# Patient Record
Sex: Male | Born: 1945
Health system: Southern US, Community
[De-identification: ages and names within clinical notes are randomized; demographics above are authoritative.]

## PROBLEM LIST (undated history)

## (undated) DIAGNOSIS — I10 Essential (primary) hypertension: Secondary | ICD-10-CM

## (undated) DIAGNOSIS — I639 Cerebral infarction, unspecified: Secondary | ICD-10-CM

## (undated) DIAGNOSIS — Z720 Tobacco use: Secondary | ICD-10-CM

## (undated) DIAGNOSIS — R519 Headache, unspecified: Secondary | ICD-10-CM

## (undated) DIAGNOSIS — R51 Headache: Secondary | ICD-10-CM

## (undated) HISTORY — DX: Tobacco use: Z72.0

## (undated) HISTORY — DX: Headache: R51

## (undated) HISTORY — DX: Headache, unspecified: R51.9

---

## 1997-06-21 HISTORY — PX: LUNG SURGERY: SHX703

## 1999-08-01 ENCOUNTER — Emergency Department (HOSPITAL_COMMUNITY): Admission: EM | Admit: 1999-08-01 | Discharge: 1999-08-01 | Payer: Self-pay | Admitting: Emergency Medicine

## 1999-08-02 ENCOUNTER — Encounter: Payer: Self-pay | Admitting: Emergency Medicine

## 1999-08-07 ENCOUNTER — Encounter: Payer: Self-pay | Admitting: Thoracic Surgery

## 1999-08-10 ENCOUNTER — Ambulatory Visit (HOSPITAL_COMMUNITY): Admission: RE | Admit: 1999-08-10 | Discharge: 1999-08-10 | Payer: Self-pay | Admitting: Thoracic Surgery

## 1999-08-10 ENCOUNTER — Encounter (INDEPENDENT_AMBULATORY_CARE_PROVIDER_SITE_OTHER): Payer: Self-pay | Admitting: Specialist

## 1999-08-14 ENCOUNTER — Encounter: Payer: Self-pay | Admitting: Thoracic Surgery

## 1999-08-17 ENCOUNTER — Encounter: Payer: Self-pay | Admitting: Thoracic Surgery

## 1999-08-17 ENCOUNTER — Inpatient Hospital Stay (HOSPITAL_COMMUNITY): Admission: RE | Admit: 1999-08-17 | Discharge: 1999-08-22 | Payer: Self-pay | Admitting: Thoracic Surgery

## 1999-08-17 ENCOUNTER — Encounter (INDEPENDENT_AMBULATORY_CARE_PROVIDER_SITE_OTHER): Payer: Self-pay | Admitting: *Deleted

## 1999-08-18 ENCOUNTER — Encounter: Payer: Self-pay | Admitting: Thoracic Surgery

## 1999-08-19 ENCOUNTER — Encounter: Payer: Self-pay | Admitting: Thoracic Surgery

## 1999-08-20 ENCOUNTER — Encounter: Payer: Self-pay | Admitting: Dermatology

## 1999-08-20 ENCOUNTER — Encounter: Payer: Self-pay | Admitting: Thoracic Surgery

## 1999-08-21 ENCOUNTER — Encounter: Payer: Self-pay | Admitting: Thoracic Surgery

## 1999-09-04 ENCOUNTER — Encounter: Admission: RE | Admit: 1999-09-04 | Discharge: 1999-09-04 | Payer: Self-pay | Admitting: Thoracic Surgery

## 1999-09-04 ENCOUNTER — Encounter: Payer: Self-pay | Admitting: Thoracic Surgery

## 1999-10-20 ENCOUNTER — Encounter: Admission: RE | Admit: 1999-10-20 | Discharge: 1999-10-20 | Payer: Self-pay | Admitting: Thoracic Surgery

## 1999-10-20 ENCOUNTER — Encounter: Payer: Self-pay | Admitting: Thoracic Surgery

## 1999-12-22 ENCOUNTER — Encounter: Admission: RE | Admit: 1999-12-22 | Discharge: 1999-12-22 | Payer: Self-pay | Admitting: Thoracic Surgery

## 1999-12-22 ENCOUNTER — Encounter: Payer: Self-pay | Admitting: Thoracic Surgery

## 2000-06-29 ENCOUNTER — Encounter: Admission: RE | Admit: 2000-06-29 | Discharge: 2000-06-29 | Payer: Self-pay | Admitting: Thoracic Surgery

## 2000-06-29 ENCOUNTER — Encounter: Payer: Self-pay | Admitting: Thoracic Surgery

## 2001-01-25 ENCOUNTER — Encounter: Admission: RE | Admit: 2001-01-25 | Discharge: 2001-01-25 | Payer: Self-pay | Admitting: Thoracic Surgery

## 2001-01-25 ENCOUNTER — Encounter: Payer: Self-pay | Admitting: Thoracic Surgery

## 2001-08-02 ENCOUNTER — Encounter: Payer: Self-pay | Admitting: Thoracic Surgery

## 2001-08-02 ENCOUNTER — Encounter: Admission: RE | Admit: 2001-08-02 | Discharge: 2001-08-02 | Payer: Self-pay | Admitting: Thoracic Surgery

## 2003-08-08 ENCOUNTER — Ambulatory Visit (HOSPITAL_COMMUNITY): Admission: RE | Admit: 2003-08-08 | Discharge: 2003-08-08 | Payer: Self-pay | Admitting: Internal Medicine

## 2003-12-20 ENCOUNTER — Ambulatory Visit (HOSPITAL_COMMUNITY): Admission: RE | Admit: 2003-12-20 | Discharge: 2003-12-20 | Payer: Self-pay | Admitting: Family Medicine

## 2005-02-24 ENCOUNTER — Ambulatory Visit (HOSPITAL_COMMUNITY): Admission: RE | Admit: 2005-02-24 | Discharge: 2005-02-24 | Payer: Self-pay | Admitting: Gastroenterology

## 2007-08-24 ENCOUNTER — Inpatient Hospital Stay (HOSPITAL_COMMUNITY): Admission: EM | Admit: 2007-08-24 | Discharge: 2007-08-25 | Payer: Self-pay | Admitting: Emergency Medicine

## 2007-08-24 ENCOUNTER — Encounter (INDEPENDENT_AMBULATORY_CARE_PROVIDER_SITE_OTHER): Payer: Self-pay | Admitting: Neurology

## 2007-08-25 ENCOUNTER — Encounter (INDEPENDENT_AMBULATORY_CARE_PROVIDER_SITE_OTHER): Payer: Self-pay | Admitting: Neurology

## 2007-08-28 ENCOUNTER — Inpatient Hospital Stay (HOSPITAL_COMMUNITY): Admission: EM | Admit: 2007-08-28 | Discharge: 2007-08-30 | Payer: Self-pay | Admitting: Emergency Medicine

## 2007-09-04 ENCOUNTER — Encounter: Admission: RE | Admit: 2007-09-04 | Discharge: 2007-10-25 | Payer: Self-pay | Admitting: Neurology

## 2009-05-24 ENCOUNTER — Inpatient Hospital Stay (HOSPITAL_COMMUNITY): Admission: EM | Admit: 2009-05-24 | Discharge: 2009-05-25 | Payer: Self-pay | Admitting: Emergency Medicine

## 2009-05-25 ENCOUNTER — Encounter (INDEPENDENT_AMBULATORY_CARE_PROVIDER_SITE_OTHER): Payer: Self-pay | Admitting: Internal Medicine

## 2009-06-17 ENCOUNTER — Encounter (INDEPENDENT_AMBULATORY_CARE_PROVIDER_SITE_OTHER): Payer: Self-pay | Admitting: Family Medicine

## 2009-06-17 ENCOUNTER — Ambulatory Visit (HOSPITAL_COMMUNITY): Admission: RE | Admit: 2009-06-17 | Discharge: 2009-06-17 | Payer: Self-pay | Admitting: Internal Medicine

## 2009-06-17 ENCOUNTER — Ambulatory Visit: Payer: Self-pay

## 2009-06-17 ENCOUNTER — Ambulatory Visit: Payer: Self-pay | Admitting: Cardiovascular Disease

## 2010-09-22 LAB — URINE CULTURE
Colony Count: NO GROWTH
Culture: NO GROWTH

## 2010-09-22 LAB — COMPREHENSIVE METABOLIC PANEL
AST: 26 U/L (ref 0–37)
Alkaline Phosphatase: 71 U/L (ref 39–117)
BUN: 10 mg/dL (ref 6–23)
CO2: 26 mEq/L (ref 19–32)
Chloride: 105 mEq/L (ref 96–112)
Creatinine, Ser: 1.04 mg/dL (ref 0.4–1.5)
GFR calc Af Amer: >60 mL/min (ref >60)
GFR calc non Af Amer: >60 mL/min (ref >60)
Potassium: 3.9 mEq/L (ref 3.5–5.1)
Total Bilirubin: 1.3 mg/dL — ABNORMAL HIGH (ref 0.3–1.2)

## 2010-09-22 LAB — LIPID PANEL
HDL: 28 mg/dL — ABNORMAL LOW (ref >39)
LDL Cholesterol: 67 mg/dL (ref 0–99)
Triglycerides: 100 mg/dL (ref <150)
VLDL: 20 mg/dL (ref 0–40)

## 2010-09-22 LAB — DIFFERENTIAL
Basophils Absolute: 0 10*3/uL (ref 0.0–0.1)
Basophils Relative: 0 % (ref 0–1)
Eosinophils Relative: 1 % (ref 0–5)
Lymphocytes Relative: 11 % — ABNORMAL LOW (ref 12–46)
Monocytes Absolute: 0.4 10*3/uL (ref 0.1–1.0)

## 2010-09-22 LAB — CBC
HCT: 51.7 % (ref 39.0–52.0)
MCV: 91.4 fL (ref 78.0–100.0)
RBC: 5.66 MIL/uL (ref 4.22–5.81)
WBC: 6.9 10*3/uL (ref 4.0–10.5)

## 2010-09-22 LAB — POCT I-STAT, CHEM 8
Calcium, Ion: 1.03 mmol/L — ABNORMAL LOW (ref 1.12–1.32)
Creatinine, Ser: 0.9 mg/dL (ref 0.4–1.5)
Glucose, Bld: 119 mg/dL — ABNORMAL HIGH (ref 70–99)
HCT: 55 % — ABNORMAL HIGH (ref 39.0–52.0)
Hemoglobin: 18.7 g/dL — ABNORMAL HIGH (ref 13.0–17.0)
Potassium: 4 mEq/L (ref 3.5–5.1)
TCO2: 25 mmol/L (ref 0–100)

## 2010-09-22 LAB — URINALYSIS, ROUTINE W REFLEX MICROSCOPIC
Glucose, UA: NEGATIVE mg/dL
Hgb urine dipstick: NEGATIVE
Protein, ur: NEGATIVE mg/dL
Specific Gravity, Urine: 1.009 (ref 1.005–1.030)
pH: 7 (ref 5.0–8.0)

## 2010-09-22 LAB — PROTIME-INR: Prothrombin Time: 13.3 seconds (ref 11.6–15.2)

## 2010-09-22 LAB — POCT CARDIAC MARKERS: Myoglobin, poc: 115 ng/mL (ref 12–200)

## 2010-09-22 LAB — HOMOCYSTEINE: Homocysteine: 9 umol/L (ref 4.0–15.4)

## 2010-10-22 ENCOUNTER — Other Ambulatory Visit: Payer: Self-pay | Admitting: Dermatology

## 2010-11-03 NOTE — H&P (Signed)
NAME:  Billy [name], Billy [name] NO.:  [id number]   MEDICAL RECORD NO.:  [id number]          PATIENT TYPE:  EMS   LOCATION:  MAJO                         FACILITY:  [facility]   PHYSICIAN:  Marlan Palau, M.D.  DATE OF BIRTH:  06/01/1946   DATE OF ADMISSION:  08/24/2007  DATE OF DISCHARGE:                              HISTORY & PHYSICAL   NEUROLOGY ADMISSION NOTE   HISTORY OF PRESENT ILLNESS:  Billy [name] is a 65 year old right-handed  white male born 1946-01-06 with a history of hypertension and  tobacco abuse.  This patient went to bed at 11:30 p.m. on August 23, 2007,  feeling well.  The patient was last seen normal at this time.  Patient  awakened at 3:00 a.m. to go the bathroom and was noted to have double  vision and staggering gait at that time.  The patient went back to bed  but had persistent symptoms this morning and decided to come to the  emergency room for further evaluation.  The patient has been noted to  have abdominal paresis with incomplete abduction of the right eye with  left lateral gaze and prominent nystagmus with rotatory component and  upbeat nystagmus, particularly with the left eye.  On primary gaze, the  left eye is inferior to the right.  The patient has reported some  numbness of the tongue.  It is not clear that the lips are numb and  believes that the numbness goes across both sides.  The patient has no  change in speech or swallowing that he knows of, does have a staggery  gait, may stagger from side to side and has not had any falls.  The  patient denies any numbness or weakness on the arms or legs, has had no  visual field changes.  The patient does report a headache but has been  treated for sinus problems over the last week or so and has been taking  daily Naproxen, Reglan and Cafergot.  The patient comes to the hospital  at this point for evaluation of a possible brain stem stroke.  NIH  stroke scale score is one.   PAST MEDICAL  HISTORY:  Is significant for:  1. New onset of  ophthalmoparesis, tongue numbness rule out stroke      event.  2. Cluster headaches/migraine.  3. Mesothelioma status post resection 9 years ago.  4. Melanoma resection in the past on the right abdomen.  5. Hypertension.  6. Cervical spine surgery.  7. Left hand fracture status post surgery in the past.   MEDICATIONS:  Include:  1. Doxazosin 8 mg daily.  2. Lotrel 10/1938 mg daily.  3. Naproxen, Reglan, Cafergot if needed for headache.  4. Aspirin 81 mg daily.   ALLERGIES:  PATIENT HAS INTOLERANCE TO MORPHINE.   SOCIAL HISTORY:  Smokes a pack of cigarettes daily.  Does not drink  alcohol.   SOCIAL HISTORY:  This patient is a widower, lives in the [place],  [place] area, is retired, has no children.  The patient has two  stepchildren.   FAMILY MEDICAL HISTORY:  Notable for the fact that mother died in her  26s and had a history of breast cancer.  Father died around age 19 with  cirrhosis of the liver from alcoholism.  Patient has two sisters who are  alive and well.  No family history of diabetes is noted.   REVIEW OF SYSTEMS:  Notable for some occasional night sweats over the  last week.  The patient does report some chronic neck pain and chronic  shortness of breath, denies chest pain, denies abdominal pain, did have  some nausea and vomiting today.  Denies any problems controlling bowels  or bladder, has not had any blackout episodes or reports of dizziness.   PHYSICAL EXAMINATION:  VITALS:  Blood pressure is 140/86, heart rate of  99, respiratory rate 18, temperature afebrile.  GENERAL:  This patient is a fairly well-developed/slightly obese white  male who is alert, cooperative at the time of examination.  HEENT:  Head  is atraumatic.  Eyes:  Pupils are round, react to light, symmetric.  Disks are flat.  NECK:  Supple.  No carotid bruits noted.  RESPIRATORY:  Examination is clear the exception of occasional  wheezes  posteriorly on the right.  CARDIOVASCULAR:  Reveals a regular rate and rhythm.  No obvious murmurs  or rubs noted.  ABDOMEN:  Reveals positive bowel sounds.  No organomegaly or tenderness  noted.  EXTREMITIES:  Without significant edema.  NEUROLOGIC:  Cranial nerves as above.  Facial symmetry is present.  The  patient on primary gaze:  It is notable that the left eye is inferior to  the right.  The patient has prominent rotatory nystagmus, particularly  looking towards the right.  With looking to the left, the patient has  upbeat nystagmus with the left eye.  Incomplete abduction of the right  eye is noted, with left lateral gaze.  The patient has good pinprick  sensation on the face, has good strength of facial muscles and muscles  of the head turning, shoulder shrug bilaterally.  Speech is well  enunciated, not aphasic.  The tongue protrusion is midline.  Motor  testing reveals 5/5 strength in all fours.  Good symmetric motor tone is  noted throughout.  Sensory testing is intact to pinprick, soft touch,  vibratory sensation throughout.  The patient has fair finger-nose-finger  and heel-to-shin bilaterally.  No drift is seen on the arms or legs.  Deep tendon reflexes are relatively symmetric.  Toes are neutral  bilaterally.  No evidence of extinction to double simultaneous  stimulation is noted.  No aphasia is noted.   NIH stroke scale score is one.  Blood work and CT scan of the brain are  pending at this time.   IMPRESSION:  One new onset of ophthalmoparesis, tongue numbness, rule  out brain stem infarct.   This patient very well may have a small mid-brain or upper pontine  infarct.  The patient has prominent nystagmus and appears to have an  inner neutral ophthalmoplegia on the right.  The patient had been on low-  dose aspirin prior to coming in.  The patient was last seen normal at  11:30 p.m. on the day prior to admission, is not a TPA candidate due to  duration of  symptoms.  Again, NIH stroke scale score is one.   PLAN:  1. Admission to [facility].  2. MRI of the brain.  3. MRI angiogram of intracranial and extracranial vessels.  4. Admission blood work, stroke panel.  5. Continue aspirin therapy.  May consider switching to Plavix or      Aggrenox.  6. Physical and occupational therapy evaluation.  7. A 2D echocardiogram.   Will follow patient's clinical course while in-house.  Will check a  chest x-ray as well.      Marlan Palau, M.D.  Electronically Signed     CKW/MEDQ  D:  08/24/2007  T:  08/24/2007  Job:  [id number]   cc:   Miguel Aschoff, M.D.  [facility]

## 2010-11-03 NOTE — Discharge Summary (Signed)
NAME:  TYLLER, BOWLBY               ACCOUNT NO.:  [id number]   MEDICAL RECORD NO.:  [id number]          PATIENT TYPE:  INP   LOCATION:  3004                         FACILITY:  [facility]   PHYSICIAN:  Pramod P. Pearlean Brownie, MD    DATE OF BIRTH:  [birth year]   DATE OF ADMISSION:  08/28/2007  DATE OF DISCHARGE:  08/30/2007                               DISCHARGE SUMMARY   ADMISSION DIAGNOSIS:  Slurred speech, right-sided weakness.   DISCHARGE DIAGNOSES:  1. Left thalamic infarct secondary to small-vessel disease.  2. Recent right mid brain infarct not visualized on initial magnetic      resonance imaging.  3. Hypertension.  4. Smoking.  5. Hyperlipidemia.   HISTORY OF PRESENT ILLNESS:  Mr. Lacroix is a 65 year old pleasant  Caucasian male who developed initially sudden onset of diplopia on August 23, 2007.  He was admitted to [facility] at that time and found  to have eye movement abnormalities with adduction of the right eye and  prominent nystagmus was noted, early component in the left eye raising  the diagnosis of right brain stem infarct.  However, the initial MRI  during that admission did not show stroke. Subsequently on the day of  discharge on August 25, 2007, MRI was repeated with thin cuts through the  brain stem which showed a possible tiny paramedian mid brain infarct.  The patient was discharged home with an eye patch.  He did initially  well, but came back a couple of days later with sudden onset of slurred  speech, right-sided weakness and numbness.  He was seen in the emergency  room.  A CT scan did not show an acute abnormality.  The patient was not  considered a candidate for TPN because of his recent stroke.  The  patient had an NIH stroke scale of only 2 on admission and modified  Rankin scale of 1.  On exam, he had minimum right-sided weakness and  some sensory loss.  He was admitted for further workup.  His symptoms  resolved in the hospital stay.  A repeat MRI scan  showed a small right  thalamic infarct and a previous brainstem infarct was no longer seen.  MRA was not performed during this hospitalization and had been done a  week ago and was unremarkable.  His electrolyte profile, CBC and  coagulation labs were all normal.  The patient had been on aspirin at  the time of admission and this was switched to Plavix during the present  hospitalization.  His diplopia persisted though his right-sided  weakness, gait and balance continued to improve during the  hospitalization.  He was seen by physical and occupational therapy and  felt to be stable enough to go home with outpatient physical and  occupational therapy consults.  The patient was also counseled to quit  smoking completely.  He was advised to maintain strict control of  hypertension, systolic blood pressure below 098 and hyperlipidemia with  LDL was below 100.  Lipid profile, 2-D echo and carotid Dopplers were  not done during this admission as they had all  been done during the last  admission a week ago.  At the time of discharge, the patient was in  stable condition.  He still had right eye adduction, depression and  weakness with rotatory nystagmus when he looked to the left.  His right-  sided weakness had resolved as did the sensory loss and coordination.   DISCHARGE MEDICATIONS:  1. Plavix 75 mg a day.  2. Doxazosin 8 mg a day.  3. Lotrel 5/40 once a day.  4. Lipitor 20 mg a day.  5. Cafergot as needed for migraine headaches.   DISCHARGE INSTRUCTIONS:  1. The patient was counseled to quit smoking, not to drive and to wear      an eye patch at all times.  2. He had an upcoming appointment with ophthalmologist, Dr. Oneta Rack on      September 12, 2007.  He was advised to keep that.  3. Advised to call his primary physician, Dr. Miguel Aschoff and see him      in the next few weeks.  4. He was also advised to call my office to make an appointment to see      me in followup in 2 months.  5.  The patient was also advised to consider participation in IRIS      Stroke Prevention Trial.  He expressed some interest.  He was      advised to call us to consider participation in the studies he      wanted to.           ______________________________  Sunny Schlein. Pearlean Brownie, MD     PPS/MEDQ  D:  08/30/2007  T:  08/31/2007  Job:  [id number]   cc:   Miguel Aschoff, M.D.

## 2010-11-03 NOTE — Discharge Summary (Signed)
NAME:  Billy [name], Billy [name]               ACCOUNT NO.:  [id number]   MEDICAL RECORD NO.:  [id number]          PATIENT TYPE:  INP   LOCATION:  3023                         FACILITY:  [facility]   PHYSICIAN:  Pramod P. Pearlean Brownie, MD    DATE OF BIRTH:  [birth year]   DATE OF ADMISSION:  08/24/2007  DATE OF DISCHARGE:  08/25/2007                               DISCHARGE SUMMARY   DIAGNOSES AT TIME OF DISCHARGE:  1. 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  2. Dyslipidemia.  3. History of cluster headaches.  4. Hypertension.  5. Mesothelioma status post resection 9 years ago.  6. Melanoma resection in the past on the right abdomen.  7. Hypertension.  8. Cervical spine surgery.  9. Left hand fracture status post surgery in the past.   MEDICINES AT TIME OF DISCHARGE:  1. Doxazosin 8 mg a day.  2. Lotrel 5/40 mg a day.  3. Naprosyn.  4. Reglan.  5. Cafergot as needed for headache.  6. Aspirin 325 mg a day.  7. Lipitor 20 mg a day.   STUDIES PERFORMED:  1. CT of the brain on admission shows no acute abnormality, benign      lesion in the right frontal skull, chronic sinusitis.  2. MRI of the brain shows no acute stroke.  3. MRA of the brain shows no significant stenosis.  4. Carotid Doppler shows no ICA stenosis.  5. A 2-D echocardiogram completed, results pending.  6. Transcranial Doppler completed, results pending.  7. EKG shows normal sinus rhythm.   LABORATORY STUDIES:  CBC:  Hemoglobin 17.5, hematocrit 52.2, white blood  cells 11.2, platelets 176, MCV 89.6, and neutrophils 89.  Chemistry  normal except for glucose at 162.  Liver function tests normal.  Cardiac  enzymes with slightly elevated troponins at 0.18, 0.17 and 0.30.  CK and  CK-MBs were normal.  Cholesterol 173, triglycerides 97, HDL 15, LDL 139.  Urinalysis negative.  Homocysteine pending.  Urine drug screen negative.  Hemoglobin A1c 6.0.   HISTORY OF PRESENT  ILLNESS:  [name] is a 65 year old right-  handed Caucasian male with a history of hypertension and tobacco abuse.  The patient went to bed at 11:30 p.m. on August 23, 2007, feeling well.  The patient was last seen normal at this time.  The patient awakened at  3 a.m. to go to the bathroom and was noted to have double vision and  staggering gait.  He went back to bed but his symptoms had persisted  into the morning when he woke up and he decided to come to the emergency  room for further evaluation.  The patient has internuclear  ophthalmoplegia with incomplete adduction of the right eye with left  lateral gaze and prominent nystagmus with rotary component and up-gaze  nystagmus in the left eye.  On primary gaze the left eye is inferior to  the right.  The patient has reported some numbness of the tongue.  It is  not clear that the lips are numb and  believes that the numbness goes  across both sides.  The patient has no change in speech or swallow that  he knows of but does have a staggering gait and may stagger from side to  side but has not had any falls.  He denies any numbness or weakness in  arms or legs and has no visual field changes.  He does report a headache  but has been treated for sinus problems over the past week or so and has  been taking daily Naprosyn, Reglan and Cafergot.  The patient comes to  the hospital for evaluation of a possible brainstem stroke.  NIH stroke  score is 1.  He was not given t-PA secondary to time of onset.   HOSPITAL COURSE:  Initial MRI was negative for acute stroke though the  patient's visual movement deficits have persisted.  It is strongly felt  that he does have a brainstem mid brain infarct in the paramedian area  that was not seen on the thick slices of MRI.  The MRI diffusion-  weighted image will be repeated with thinner slices prior to his  discharge to confirm the presence of stroke.  Otherwise, the patient has  had vascular risk  factors of mild hypertension and dyslipidemia for  which new Lipitor was added.  He has been evaluated by physical therapy  and felt he would benefit from outpatient physical therapy as well as  benefit from an eye patch with rotation every 4 hours to increase his  visual acuity and decreased double vision.  Otherwise, the patient is  safe to return home.  It is felt his stroke is secondary to small-vessel  disease.  It is of note the patient did have elevated troponins in-  hospital.  Cardiology, Dr. Armanda Magic, was asked to consult as she has  seen him as an outpatient within the past week.  It is felt that his  elevated troponins are likely multifactorial from cluster headaches and  Cafergot use x1 week on top of new stroke.  There is no evidence of  acute coronary syndrome.  No further cardiac workup is needed and they  signed off.   CONDITION ON DISCHARGE:  The patient alert and oriented x3.  No aphasia.  No dysarthria.  There is skew eye deviation.  His right eye is  hypertrophic with down-beat and left lateral gaze nystagmus.  He has  decreased adduction of right eye, nystagmus left eye on lateral gaze.  No facial numbness noted.  His gait is steady with one eye closed.   DISCHARGE PLAN:  1. Discharge home with family.  2. Aspirin for stroke prevention.  3. New Lipitor.  Will need follow-up by primary care physician.  4. Needs 24-hour supervision at discharge for safety.  5. Follow-up Dr. Delia Heady in 2-3 months.  6. Follow-up primary care physician for risk factor control in 1-2      weeks.      Annie Main, N.P.    ______________________________  Sunny Schlein. Pearlean Brownie, MD    SB/MEDQ  D:  08/25/2007  T:  08/27/2007  Job:  [id number]   cc:   C. Duane Lope, M.D.

## 2010-11-03 NOTE — Consult Note (Signed)
NAME:  MARQUESE, BURKLAND               ACCOUNT NO.:  [id number]   MEDICAL RECORD NO.:  [id number]          PATIENT TYPE:  INP   LOCATION:  3023                         FACILITY:  [facility]   PHYSICIAN:  Armanda Magic, M.D.     DATE OF BIRTH:  [birth year]   DATE OF CONSULTATION:  DATE OF DISCHARGE:  08/25/2007                                 CONSULTATION   REFERRING PHYSICIAN:  Neurology, Dr. Pearlean Brownie.   CHIEF COMPLAINT:  Elevated troponin.   HISTORY OF PRESENT ILLNESS:  This 65 year old male who was admitted  yesterday after onset of unsteady gait, double vision, and tongue  numbness.  He was admitted through the emergency room.  There were no  acute findings on the CT or MRI.  The patient also admits to use of  Cafergot daily x1 week for cluster headache.   As part of the stroke workup, cardiac enzymes were drawn, with minimal  troponin elevation.  We are now asked to consult.  The patient denies  any chest pain, palpitations, PND, orthopnea, dizziness, or syncope.  Although in the above-stated symptoms, his review of systems is  completely negative.  Of note, he had had chest pain earlier this year  and underwent a exercise Cardiolite study on August 17, 2007, which  revealed no ischemia, normal LV size and function with no regional wall  motion abnormality.  A 2-D echocardiogram in our office showed basal  septum hypertrophy and diastolic relaxation abnormality with normal LV  function.   ALLERGIES:  INCLUDE MORPHINE WHICH CAUSES NAUSEA.   MEDICATIONS:  Currently include Norvasc 5 mg a day, aspirin 325 mg a  day, Lotensin 40 mg a day, Cardura 8 mg a day, Lovenox 40 mg subcu  daily.   SOCIAL HISTORY:  He is widowed.  He smokes one cigarettes per day.  He  does drink coffee but no illicit drug use, no alcohol.   FAMILY HISTORY:  His father died at 8 of cirrhosis, his mother died at  32 of breast CA.  He has two sisters alive and well.   PAST MEDICAL HISTORY:  Includes hypertension  and mesothelioma.  As  stated above, review of systems is negative, except for the symptoms as  stated in the top part of the HPI.  He has had some problems with  cluster headaches recently, and has had some double vision in the  setting of his TIA.  He denies any chest pain, shortness of breath.  No  urinary symptoms, no nausea, vomiting or diaphoresis, no diarrhea.  No  joint pain, no syncope.   PHYSICAL EXAM:  VITAL SIGNS:  Blood pressure is 129/63, respirations 20.  He is afebrile, O2 saturations is 97% room air.  HEENT:  Benign.  NECK:  Supple without lymphadenopathy, carotid upstrokes +2 bilaterally,  no bruits.  LUNGS:  Clear to auscultation throughout.  HEART:  Regular rate and rhythm.  No murmurs, rubs or gallops, S1, S2.  ABDOMEN:  Benign.  EXTREMITIES:  Without edema.   LABORATORY:  CPK-MB 88, 80 and 62, MB 3.3, 3.3 and 2.6, troponin 0.18,  0.17, and  0.30.  Total cholesterol 173, LDL 139, HDL 15, triglycerides  97.  Urine drug screen negative.  Sodium 142, potassium 4.1, chloride  103, bicarb 28, BUN 13, creatinine 0.94, glucose 162, white cell count  11.2, hemoglobin 17.5, hematocrit 52.2, platelet count 176.  EKG done on  admission shows normal sinus rhythm at 99 beats per minute, with no ST-T  wave abnormalities.  QTC is normal at 441 milliseconds.   ASSESSMENT:  1. Mildly-elevated troponin in the setting of a TIA and also with a      recent history of cluster headache using Cafergot for one week.  He      has not had any cardiac symptoms of chest pain, shortness of      breath, nausea, vomiting, diaphoresis.  His cardiac CPK and MBs are      completely normal, and his troponin is minimally elevated.  Given      the fact that the patient has been completely asymptomatic from a      cardiac standpoint and he had a recent stress test in the past few      weeks which showed no inducible ischemia and normal LV function, I      would not recommend the need for first  further workup from a      cardiac standpoint.  I think these troponins are nonspecific, given      the normal CPK-MB.      Armanda Magic, M.D.  Electronically Signed     TT/MEDQ  D:  08/25/2007  T:  08/26/2007  Job:  [id number]   cc:   Pramod P. Pearlean Brownie, MD  Miguel Aschoff, M.D.

## 2010-11-03 NOTE — H&P (Signed)
NAME:  MITHRAN, STRIKE NO.:  [id number]   MEDICAL RECORD NO.:  [id number]          PATIENT TYPE:  INP   LOCATION:  3004                         FACILITY:  [facility]   PHYSICIAN:  Deanna Artis. Hickling, M.D.DATE OF BIRTH:  September 03, 1945   DATE OF ADMISSION:  08/28/2007  DATE OF DISCHARGE:                              HISTORY & PHYSICAL   CHIEF COMPLAINT:  Slurred speech, right facial weakness, right arm  weakness since 3:45 p.m. March 9.   Mr. Kubisiak is a 65 year old right-handed Caucasian gentleman who was  admitted to [facility] March 5th and 6th.  The patient went to  bed and 11:30 March 4 feeling well.  He awakened at 3:00 a.m. and had  diplopia and staggering gait.  He went back to bed.  His symptoms  persisted and he presented for evaluation.  He had incomplete adduction  of the right eye with left lateral gaze, prominent nystagmus with a  rotatory component and upbeat nystagmus, particularly in the left eye.  On primary gaze, the left eye was inferior to the right.  The patient  complained of numbness in his tongue.   He had a CT scan of the brain which failed to show evidence of an  abnormality.  He had MRI scan of the brain, also failed to show evidence  of an acute stroke.  Repeat MRI with thin cuts and diffusion weighted  imaging suggested bursal pontine lesion that was subcentimeter in  nature.  This was not reported in the discharge summary.  MRA  intracranial showed no significant stenosis.  Carotid Doppler showed no  internal carotid artery stenosis.  Transcranial Doppler was pending.  2-  D echocardiogram was pending, however, results that are now available  show evidence of normal ejection fraction, no evidence of left  ventricular regional wall abnormality, mild focal basil hypertrophy,  abnormal left ventricular relaxation, left atrial dilatation that is  mild.  The left atrium contributes more to the filling of the left  ventricle than is  normal.  No other abnormalities were seen.   The patient had hemoglobin A1c that was 6.  Fasting lipids:  Cholesterol  173, triglyceride 97, HDL 15, LDL 139, BLDL 19.  Serum homocysteine was  9.7.  The patient was discharged home on doxazosin 8 mg a day, Lotrel  5/40 daily, aspirin 325 mg daily, Lipitor 20 mg daily.  He is asked not  to take Naproxen, Reglan and Cafergot for his headaches.  He had had 2  weeks of headache leading up to that stroke.   The patient's headaches had been moderate over the weekend.  He went to  bed feeling at baseline last night.  This morning, he would woke up and  said he did not feel right.  He went out to breakfast with his son but  appeared depressed, was not speaking much.  He said that he had problems  with concentration.  He had difficulty explaining exactly how he felt.   He was with his friend with whom he stays.  This afternoon he developed  right facial weakness, slurred  speech, and right arm weakness of 3:45  p.m. and was brought to [facility] emergency room at 4:06 p.m.  I was  called 12 minutes later as a code stroke.  He had a CT scan which I  interpreted STAT at 1625 and examined him at 1630.  NIH stroke scale at  1640 was 2.  His modified Rankin prior to this time was 1.   PAST MEDICAL HISTORY:  1. Remarkable only for migraines in cluster.  2. He had a mesothelioma.  3. Melanoma.  4. Cervical spondylosis.  5. Fracture of the left hand.   PAST SURGICAL HISTORY:  1. Resection of mesothelioma 9 years ago.  2. Resection of melanoma.  3. Cervical laminectomy.  4. Left hand open reduction internal fixation.   RISK FACTORS FOR STROKE:  1. Hypertension.  2. Dyslipidemia.  3. Smoking.   CURRENT MEDICATIONS:  1. Doxazosin 8 mg daily.  2. Lotrel 5/40 daily.  3. Aspirin 325 mg daily.  4. Lipitor 20 mg in the evening.   ALLERGIES:  None.  Intolerance to morphine.   FAMILY HISTORY:  Mother died in her 40s.  She had breast cancer.   Father  died age 59 of alcoholic cirrhosis.  He has two sisters who are alive  and well.   SOCIAL HISTORY:  Is widowed.  He stays with a friend.  He has two  stepchildren.  He smokes one package of cigarettes per day.  He does not  drink alcohol nor use drugs.  He has been counseled about smoking  cessation.  I did so again.  Of note, on his last hospitalization, he  had elevated troponins.  He had a consultation with Carolanne Grumbling and no  other recommendations were made.   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  He is also depressed.  He has chronic neck pain, chronic  shortness of breath.  He has not had nausea and vomiting today, although  he did on the day of his first stroke.  12 system review otherwise  negative.   EXAMINATION TODAY:  Temperature 97.9, blood pressure 145/89, resting  pulse 95, respirations 18, oxygen saturation 100. EARS/NOSE/THROAT:  No  signs of infection.  No bruits.  Supple neck.  LUNGS:  Clear.  HEART:  No murmurs.  Pulses normal.  ABDOMEN:  Bowel sounds normal.  Protuberant.  No hepatosplenomegaly.  EXTREMITIES:  Right medial rectus shows weakness in adduction.  He does not show significant nystagmus with his eye movements.  His  visual fields are full.  He has mild right central seventh that is more  prominent when he is at rest than when he forcibly smiles.  Nasolabial  fold is also diminished but increases when he smiles.  He protrudes his  tongue and elevates his uvula in midline.  Air conduction greater than  bone conduction bilaterally.  He does not complain of numbness of his  tongue.  MOTOR EXAMINATION:  No pronator drift.  The patient had excellent  strength, bulk and tone in his limbs.  The right hand appears to be  apraxic.  When I asked him to oppose his thumb and his fingers he said,  how am I going to do that?.  I had him do with the left hand and I  said do the same thing with the right and he was  able to do it.  He is a  bit clumsier with his fine motor movements  on the right.  He wiggles his  toes equally.  SENSATION:  No hemisensory.  Good stereognosis.  Normal proprioception  and vibration.  CEREBELLAR EXAMINATION:  Good finger-to-nose.  Rapid  alternating movements are somewhat better in the left hand than the  right.  Gait was not tested.  Deep tendon reflexes were absent.  The  patient had bilateral flexor plantar responses.   CT shows a wedge-shaped lesion in the left thalamus that was not present  previously.  This had been confirmed by Radiology.  Sodium 140,  potassium 3.8, chloride 104, CO2 13, BUN 15, creatinine 1.1, glucose  114.  White blood cell count 9000, hemoglobin 17.5, hematocrit 51.1,  platelet count 209,000, 74 polys, 14 lymphs, 10 monos, 1 eosinophil.  Absolute neutrophil count 6.7.  PT 13.1, INR 1, PTT 33.   IMPRESSION:  New subacute left thalamic infarction. 434.01  There is  nothing I can do because of his recent stroke and the uncertain time of  onset.  Also, the well demarcation this is still a subacute stroke.  It  marks it is occurring sometime within the past 12 hours.  I do not think  that he would be a good candidate for TPA because of this.  He also will  not fit the criteria for any of the neural interventional or stroke  research trials.   PLAN:  We are going to repeat the MRI scan of the brain.  Track down  prior workup with a TCD.  We need to consider switching him to Aggrenox.  I appreciate the opportunity to participate in his care.      Deanna Artis. Sharene Skeans, M.D.  Electronically Signed     [facility]/MEDQ  D:  08/28/2007  T:  08/29/2007  Job:  [id number]   cc:   Pramod P. Pearlean Brownie, MD  C. Duane Lope, M.D.

## 2010-11-06 NOTE — Op Note (Signed)
NAME:  Billy [name], Billy [name] NO.:  [id number]   MEDICAL RECORD NO.:  [id number]          PATIENT TYPE:  AMB   LOCATION:  ENDO                         FACILITY:  [facility]   PHYSICIAN:  Danise Edge, M.D.   DATE OF BIRTH:  [birth year]   DATE OF PROCEDURE:  02/24/2005  DATE OF DISCHARGE:                                 OPERATIVE REPORT   PROCEDURE:  Colonoscopy.   REFERRING PHYSICIAN:  Daisy Floro, M.D.   PROCEDURE INDICATIONS:  [name] is a 65 year old male born  [birth year].  [name] is undergoing diagnostic colonoscopy to  evaluate lower gastrointestinal bleeding manifested by painless  hematochezia.   ENDOSCOPIST:  Danise Edge, M.D.   PREMEDICATION:  Versed 7 mg, Demerol 70 mg.   DESCRIPTION OF PROCEDURE:  After obtaining informed consent [name] was  placed in the left lateral decubitus position.  I administered intravenous  Demerol and intravenous Versed to achieve conscious sedation for the  procedure.  The patient's blood pressure, oxygen saturation and cardiac  rhythm were monitored throughout the procedure and documented in the medical  record.   Anal inspection and digital rectal exam were normal.  The prostate was  nonnodular.  The Olympus adjustable pediatric colonoscope was introduced  into the rectum and advanced to the cecum.  Colonic preparation for the exam  today was satisfactory.   Rectum normal.  Retroflex view of the distal rectum normal.  Moderate sized  internal hemorrhoids which are nonbleeding are present.   Sigmoid colon and descending colon.  Extensive nonbleeding left colonic  diverticulosis.   Splenic flexure normal.   Transverse colon normal.   Hepatic flexure normal.   Ascending colon normal.   Cecum and ileocecal valve normal.   ASSESSMENT:  1.  Normal proctocolonoscopy to the cecum.  2.  Extensive left colonic diverticulosis present.  3.  Moderate size nonbleeding internal hemorrhoids are  present.           ______________________________  Danise Edge, M.D.     MJ/MEDQ  D:  02/24/2005  T:  02/24/2005  Job:  [id number]   cc:   C. Duane Lope, M.D.  [address]  [place]  [place] [address]  Fax: [phone]

## 2010-11-06 NOTE — Procedures (Signed)
[place]. [facility]  Patient:    Billy [name], Billy [name]                      MRN: [id number] Proc. Date: 08/17/99 Adm. Date:  [id number] Attending:  Cameron Proud CC:         Guadalupe Maple, M.D.                           Procedure Report  PROCEDURE:  Thoracic epidural catheter placement for postoperative pain control.  HISTORY:  [name] is a 65 year old white male with a history of a pleural based mass in the right thorax.  He underwent a right thoracotomy and decortication by Dr. Norton Blizzard under general anesthesia.  Prior to the procedure, the  risks and benefits of epidural pain control were discussed with the patient. He understood this and agreed to go ahead with this form of pain control following  surgery.  PROCEDURE:  Upon completion of the procedure, while the patient was still under  general endotracheal anesthesia and in the left lateral decubitus position, the low back was prepped and draped sterilely.  The T11-12 interspace was entered in the midline using an 18-gauge Tuohy needle.  Two passes of the needle were required to enter the epidural space using loss of resistance technique with air. Aspiration of the needle was negative for blood or CSF.  Then, 10 cc of preservative-free saline to which was added 50 mg of 1% lidocaine and 100 mcg of fentanyl were injected through the needle without difficulty.  The catheter was then threaded  4 cm into the epidural space and the needle was removed without difficulty. Previous attempts at the T7-8 and T9-10 interspaces were unsuccessful due to an  inability to pass the needle between the bony laminae.   The patient was then brought to the recovery room in stable condition.  There, he was begun on an epidural fentanyl and Marcaine infusion and followed on a daily basis by the anesthesia service.  He tolerated the procedure well without apparent complications. DD:   08/17/99 TD:  08/17/99 Job: [id number]

## 2010-11-06 NOTE — Op Note (Signed)
[place]. [facility]  Patient:    Billy [name], Billy [name]                      MRN: [id number] Proc. Date: 08/10/99 Adm. Date:  [id number] Attending:  Cameron Proud                           Operative Report  PREOPERATIVE DIAGNOSIS:  Right medial basilar mass of the right lower lobe.  POSTOPERATIVE DIAGNOSIS:  Right medial basilar mass, right lower lobe.  OPERATION:  Fiberoptic bronchoscopy and mediastinoscopy.  SURGEON:  D. Karle Plumber, M.D.  DESCRIPTION OF PROCEDURE:  After adequate general anesthesia, the fiberoptic bronchoscope was passed through the endotracheal tube.  The carina was in the midline.  The left mainstem, left upper lobe, left lower lobe orifices were normal. On the right side the right mainstem orifice was normal.  The right upper lobe orifice was normal.  The middle lobe orifice was normal.  There was some narrowing of the right lower lobe orifice, medial basilar segments.  Brushings were taken  from this area, as well as cytologies.  The fiberoptic bronchoscope was removed and the anterior neck was prepped and draped in the usual sterile manner.  A transverse incision was made and carried down with electrocautery through the subcutaneous  tissue.  The pretracheal fascia was entered and the video mediastinoscope was inserted.  Biopsies of large 4R nodes were done.  No other nodes could be seen other than this large 4R node.  The video mediastinoscope was removed.  The strap muscles were closed with #2-0 Dexon, the subcutaneous tissue with #3-0 Vicryl, nd Steri-Strips were applied.  The patient was transferred in stable condition. DD:  08/10/99 TD:  08/10/99 Job: [id number]

## 2010-11-06 NOTE — Discharge Summary (Signed)
[place]. [facility]  Patient:    Billy [name], Billy [name]                      MRN: [id number] Adm. Date:  [id number] Disc. Date: 08/22/99 Attending:  Cameron Proud Dictator:   Eugenia Pancoast, P.A. CC:         Norton Blizzard, M.D.             Miguel Aschoff, M.D.             Clabe Seal. Meryl Crutch, M.D.                           Discharge Summary  DATE OF BIRTH:  07-29-1945.  FINAL DIAGNOSES: 1. Pleural fibrosis 2. Chronic inflammation right pleura. 3. Status post melanoma excision. 4. History of cluster headaches. 5. History of asbestos exposure.  PROCEDURES:  August 17, 1999:  Right video-assisted thoracoscopy and right thoracotomy with decortication in right middle and lower lobe and visceral pleurectomy and pleural biopsies.  SURGEON:  Dr. Edwyna Shell.  ADMISSION HISTORY:  This is 65 year old gentleman who was referred by Dr. Tenny Craw or evaluation of right lower lobe mass and pleural thickening with calcification. The patient had presented two weeks prior to this admission to the emergency department with a chief complaint of anginal pain.  During his workup a chest x-ray was taken which revealed a right lower lobe lesion.  Follow-up CT scan revealed 3.5 x 5 cm mass in mid segment right lower lobe.  Also, there were some areas of pulmonary  calcific and pleural plaques in both sides.  The patient was referred to Dr. Edwyna Shell for further evaluation.  Dr. Edwyna Shell discussed surgery.  Risks and benefits of the surgery were explained.  The patient understood and agreed to surgery.  HOSPITAL COURSE:  The patient was admitted to [facility] on August 17, 1999.  At that time he underwent a right VAT with right thoracotomy and decortication right middle and lower lobes and visceral pleurectomy and pleural  biopsies.  The patient tolerated the procedure well.  There were no intraoperative complications.  Postoperatively, the patient did  satisfactorily.  No untoward events occurred during his stay.  He had chest tubes inserted at the end of the  procedure.  There were two 28-French chest tubes inserted.  These remained in until the second postoperative day when posterior chest tube was removed.  There was o air leak noted, and drainage was minimal.  Subsequently, the second chest tube as removed on August 21, 1999.  The patients incisions were all healing satisfactorily. No sign of infection was noted.  The final lab work showed a sodium of 139, potassium 3.7, chloride 101, CO2 of 27, BUN 11, creatinine 1.0, glucose 172. White cell count was 8.4, hemoglobin 12.3, hematocrit 35.6, and platelets 199.  The patient continued to progress in a satisfactory manner and subsequently prepared for discharge.  The pleura had been sent for pathology, and pathology came back and revealed the pleura to be hyalinized pleural fibrosis and mild chronic inflammation.  There was no sign of any tumor.  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  The patient was informed of this and subsequently discharged home in satisfactory and stable condition on August 22, 1999.  DISCHARGE MEDICATIONS:  At time of discharge the patients medications were: 1. Reglan 10 mg p.o. p.r.n. 2. Carafate 1/100 one  p.r.n. 3. Tylox one or two p.o. q.4-6h. p.r.n. pain.  FOLLOWUP:  The patient will follow up with Dr. Edwyna Shell on Friday, March 16 at 2:40 p.m.  He will get a chest x-ray from [facility] on Friday, March 16 at 1:40 p.m. DD:  08/21/99 TD:  08/22/99 Job: [id number]

## 2010-11-06 NOTE — Op Note (Signed)
[place]. [facility]  Patient:    Billy [name], Billy [name]                      MRN: [id number] Proc. Date: 08/17/99 Adm. Date:  [id number] Attending:  Cameron Proud CC:         Norton Blizzard, M.D. (2)                           Operative Report  PREOPERATIVE DIAGNOSIS:  Right lower lobe mass, pleural plaques.  POSTOPERATIVE DIAGNOSIS:  Pleural plaque with fibrothorax and entrapped right lower lobe and right middle lobe.  OPERATION/PROCEDURE:  Right video assisted thoracoscopy, right thoracotomy, decortication of right lower lobe and right middle lobe.  SURGEON:  D. Karle Plumber, M.D.  ASSISTANT:  Eugenia Pancoast, P.A.  INDICATIONS FOR PROCEDURE:  This patient was thought to have lung cancer because of a rounded density in the right lower lobe.  He underwent bronchoscopy and mediastinoscopy, which were negative and no endobronchial lesions could be seen  even though this lesion was seen in the right lower lobe.  He also was noted on CT scan on that side to have pleural plaques and exposure to asbestosis.  DESCRIPTION OF PROCEDURE:  After general anesthesia and percutaneous insertions of all monitoring lines the patient was turned to the right lateral thoracotomy position and was prepped and draped in the usual sterile manner.  Two trocar sites were made in the anterior and posterior direction in line with the seventh intercostal space and two trocars were inserted.  Exploration was carried out and the patient indeed had marked pleural plaquing of the diaphragm as well as the entire pleural space, but particularly laterally, and the right lower lobe and right middle lobe were encapsulated with a thick visceral peel.  A posterolateral thoracotomy incision was made and carried down with electrocautery through the subcutaneous tissue.  The latissimus was divided with electrocautery and the serratus was reflected anteriorly.  Two TPAs were  placed at right angles in the  fifth intercostal space.  A portion of the sixth rib was taken subperiosteally t the angle.  The right lower lobe was palpated and other than the peel no masses  could be felt.  It was thought the patients mass effect was secondary to entrapment of the right lower lobe secondary to the thick visceral peel.  It was decided to do a decortication.  Dissection was started in the right lower lobe,  peeling off a 3 to 4 mm visceral peel that involved the medial basilar segments but not the superior segments.  This visceral peel was dissected off of the lung with sharp and blunt dissection until the entire right lower lobe was removed.  The Kittners were used to do the left dissection of the peel off the right lower lobe. After all the right lower lobe was removed attention was turned to the middle lobe. The peel had obliterated the fissure and the fissure had to be reestablished by  dividing the peel.  Then the right middle lobe, which was also almost encapsulated, was dissected off with sharp and blunt dissection, removing the entire peel off of the right middle lobe.  The right middle lobe was then freed up from the surrounding pleura as well as the right lower lobe.  No masses could be felt in the right lower lobe or the right middle lobe, and the lungs re-expanded well  after  removing the peel.  Attention was then turned to the parietal pleura and starting laterally a thickened 3 to 4 mm calcified peel was dissected off, dissecting it  down to the diaphragm and up superiorly to the level of the azygous vein.  This  thickened peel was removed down to the vertebral column.  After this had been done attention was turned to the diaphragm, which had a very thickened peel and this was as much as possible taken off with electrocautery.  At one point there was about a 3 to 4 cm hole in the diaphragm, which was sutured with 0 Prolene  in interrupted fashion.  After most of the peel had been removed from the diaphragm dissection was turned superiorly to the plaques going up toward the apex, and this was dissected extrapleurally off the apex and divided with electrocautery.  Two chest tubes were then placed through the trocar sites and tied in place with 0 silk.  The lung was re-expanded.  The chest was closed with #2 pericostals, #1 Dexon in the muscle layer, 2-0 Dexon in the subcutaneous tissue, and Ethicon skin clips.  The patient was returned to the recovery room in stable condition. DD:  08/19/99 TD:  08/19/99 Job: [id number]

## 2011-03-15 LAB — LIPID PANEL
Cholesterol: 173
HDL: 15 — ABNORMAL LOW
Total CHOL/HDL Ratio: 11.5
Triglycerides: 97
VLDL: 20

## 2011-03-15 LAB — CBC
HCT: 51.1
HCT: 52.2 — ABNORMAL HIGH
Hemoglobin: 17.5 — ABNORMAL HIGH
MCHC: 33.4
MCV: 88.2
MCV: 89.6
Platelets: 176
RDW: 13.4
WBC: 11.2 — ABNORMAL HIGH
WBC: 9

## 2011-03-15 LAB — URINALYSIS, ROUTINE W REFLEX MICROSCOPIC
Bilirubin Urine: NEGATIVE
Bilirubin Urine: NEGATIVE
Glucose, UA: NEGATIVE
Ketones, ur: NEGATIVE
Ketones, ur: NEGATIVE
Nitrite: NEGATIVE
Nitrite: NEGATIVE
Protein, ur: NEGATIVE
Specific Gravity, Urine: 1.01
Specific Gravity, Urine: 1.013
Urobilinogen, UA: 1
pH: 7.5

## 2011-03-15 LAB — DIFFERENTIAL
Basophils Absolute: 0
Basophils Absolute: 0.1
Basophils Relative: 1
Eosinophils Absolute: 0.1
Eosinophils Relative: 0
Eosinophils Relative: 1
Lymphocytes Relative: 14
Lymphocytes Relative: 7 — ABNORMAL LOW
Lymphs Abs: 0.8
Monocytes Absolute: 0.4
Monocytes Absolute: 0.9
Monocytes Relative: 3
Neutro Abs: 10 — ABNORMAL HIGH

## 2011-03-15 LAB — APTT: aPTT: 30

## 2011-03-15 LAB — I-STAT 8, (EC8 V) (CONVERTED LAB)
BUN: 15
Bicarbonate: 29.1 — ABNORMAL HIGH
HCT: 54 — ABNORMAL HIGH
Operator id: 151321
pCO2, Ven: 51.8 — ABNORMAL HIGH

## 2011-03-15 LAB — COMPREHENSIVE METABOLIC PANEL
ALT: 21
AST: 17
AST: 22
Albumin: 3.4 — ABNORMAL LOW
Albumin: 3.5
Alkaline Phosphatase: 62
BUN: 13
Calcium: 8.8
Chloride: 102
Chloride: 103
Creatinine, Ser: 0.94
GFR calc Af Amer: >60
GFR calc Af Amer: >60
Potassium: 3.7
Total Bilirubin: 0.8
Total Bilirubin: 1.3 — ABNORMAL HIGH
Total Protein: 7.1

## 2011-03-15 LAB — HEMOGLOBIN A1C: Hgb A1c MFr Bld: 6

## 2011-03-15 LAB — CK TOTAL AND CKMB (NOT AT ARMC)
CK, MB: 0.8
CK, MB: 0.8
CK, MB: 3.3
Relative Index: INVALID
Relative Index: INVALID
Relative Index: INVALID
Total CK: 88

## 2011-03-15 LAB — RAPID URINE DRUG SCREEN, HOSP PERFORMED
Benzodiazepines: NOT DETECTED
Cocaine: NOT DETECTED
Opiates: NOT DETECTED
Tetrahydrocannabinol: NOT DETECTED

## 2011-03-15 LAB — POCT I-STAT CREATININE: Creatinine, Ser: 1.1

## 2011-03-15 LAB — POCT CARDIAC MARKERS
Myoglobin, poc: 46.8
Operator id: 151321

## 2011-03-15 LAB — TROPONIN I
Troponin I: 0.01
Troponin I: 0.03
Troponin I: 0.17 — ABNORMAL HIGH
Troponin I: 0.18 — ABNORMAL HIGH
Troponin I: 0.3 — ABNORMAL HIGH

## 2011-03-15 LAB — HOMOCYSTEINE: Homocysteine: 10.2

## 2011-06-24 DIAGNOSIS — N509 Disorder of male genital organs, unspecified: Secondary | ICD-10-CM | POA: Diagnosis not present

## 2011-07-15 DIAGNOSIS — H349 Unspecified retinal vascular occlusion: Secondary | ICD-10-CM | POA: Diagnosis not present

## 2011-07-20 DIAGNOSIS — H349 Unspecified retinal vascular occlusion: Secondary | ICD-10-CM | POA: Diagnosis not present

## 2011-07-23 DIAGNOSIS — L570 Actinic keratosis: Secondary | ICD-10-CM | POA: Diagnosis not present

## 2011-07-23 DIAGNOSIS — L821 Other seborrheic keratosis: Secondary | ICD-10-CM | POA: Diagnosis not present

## 2011-08-06 DIAGNOSIS — N509 Disorder of male genital organs, unspecified: Secondary | ICD-10-CM | POA: Diagnosis not present

## 2011-08-10 DIAGNOSIS — H349 Unspecified retinal vascular occlusion: Secondary | ICD-10-CM | POA: Diagnosis not present

## 2011-09-07 DIAGNOSIS — R5381 Other malaise: Secondary | ICD-10-CM | POA: Diagnosis not present

## 2011-09-07 DIAGNOSIS — R5383 Other fatigue: Secondary | ICD-10-CM | POA: Diagnosis not present

## 2011-09-09 DIAGNOSIS — H3090 Unspecified chorioretinal inflammation, unspecified eye: Secondary | ICD-10-CM | POA: Diagnosis not present

## 2011-09-24 DIAGNOSIS — I10 Essential (primary) hypertension: Secondary | ICD-10-CM | POA: Diagnosis not present

## 2011-09-24 DIAGNOSIS — R5383 Other fatigue: Secondary | ICD-10-CM | POA: Diagnosis not present

## 2011-09-24 DIAGNOSIS — R42 Dizziness and giddiness: Secondary | ICD-10-CM | POA: Diagnosis not present

## 2011-09-24 DIAGNOSIS — R5381 Other malaise: Secondary | ICD-10-CM | POA: Diagnosis not present

## 2011-09-24 DIAGNOSIS — I951 Orthostatic hypotension: Secondary | ICD-10-CM | POA: Diagnosis not present

## 2011-09-29 DIAGNOSIS — R42 Dizziness and giddiness: Secondary | ICD-10-CM | POA: Diagnosis not present

## 2011-09-29 DIAGNOSIS — R5381 Other malaise: Secondary | ICD-10-CM | POA: Diagnosis not present

## 2011-09-29 DIAGNOSIS — I951 Orthostatic hypotension: Secondary | ICD-10-CM | POA: Diagnosis not present

## 2011-09-29 DIAGNOSIS — I10 Essential (primary) hypertension: Secondary | ICD-10-CM | POA: Diagnosis not present

## 2011-09-29 DIAGNOSIS — Z79899 Other long term (current) drug therapy: Secondary | ICD-10-CM | POA: Diagnosis not present

## 2011-09-29 DIAGNOSIS — R5383 Other fatigue: Secondary | ICD-10-CM | POA: Diagnosis not present

## 2011-10-04 DIAGNOSIS — R42 Dizziness and giddiness: Secondary | ICD-10-CM | POA: Diagnosis not present

## 2011-10-05 DIAGNOSIS — R42 Dizziness and giddiness: Secondary | ICD-10-CM | POA: Diagnosis not present

## 2011-10-05 DIAGNOSIS — R5381 Other malaise: Secondary | ICD-10-CM | POA: Diagnosis not present

## 2011-10-05 DIAGNOSIS — I951 Orthostatic hypotension: Secondary | ICD-10-CM | POA: Diagnosis not present

## 2011-10-05 DIAGNOSIS — I10 Essential (primary) hypertension: Secondary | ICD-10-CM | POA: Diagnosis not present

## 2011-10-05 DIAGNOSIS — R5383 Other fatigue: Secondary | ICD-10-CM | POA: Diagnosis not present

## 2011-10-27 DIAGNOSIS — I10 Essential (primary) hypertension: Secondary | ICD-10-CM | POA: Diagnosis not present

## 2011-10-27 DIAGNOSIS — R42 Dizziness and giddiness: Secondary | ICD-10-CM | POA: Diagnosis not present

## 2011-11-11 DIAGNOSIS — H3090 Unspecified chorioretinal inflammation, unspecified eye: Secondary | ICD-10-CM | POA: Diagnosis not present

## 2012-02-28 DIAGNOSIS — K219 Gastro-esophageal reflux disease without esophagitis: Secondary | ICD-10-CM | POA: Diagnosis not present

## 2012-02-28 DIAGNOSIS — M79609 Pain in unspecified limb: Secondary | ICD-10-CM | POA: Diagnosis not present

## 2012-02-28 DIAGNOSIS — I10 Essential (primary) hypertension: Secondary | ICD-10-CM | POA: Diagnosis not present

## 2012-02-28 DIAGNOSIS — I679 Cerebrovascular disease, unspecified: Secondary | ICD-10-CM | POA: Diagnosis not present

## 2012-02-28 DIAGNOSIS — E7800 Pure hypercholesterolemia, unspecified: Secondary | ICD-10-CM | POA: Diagnosis not present

## 2012-02-28 DIAGNOSIS — N329 Bladder disorder, unspecified: Secondary | ICD-10-CM | POA: Diagnosis not present

## 2012-02-29 DIAGNOSIS — E7800 Pure hypercholesterolemia, unspecified: Secondary | ICD-10-CM | POA: Diagnosis not present

## 2012-02-29 DIAGNOSIS — I679 Cerebrovascular disease, unspecified: Secondary | ICD-10-CM | POA: Diagnosis not present

## 2012-02-29 DIAGNOSIS — N329 Bladder disorder, unspecified: Secondary | ICD-10-CM | POA: Diagnosis not present

## 2012-02-29 DIAGNOSIS — I10 Essential (primary) hypertension: Secondary | ICD-10-CM | POA: Diagnosis not present

## 2012-02-29 DIAGNOSIS — K219 Gastro-esophageal reflux disease without esophagitis: Secondary | ICD-10-CM | POA: Diagnosis not present

## 2012-03-21 DIAGNOSIS — H3090 Unspecified chorioretinal inflammation, unspecified eye: Secondary | ICD-10-CM | POA: Diagnosis not present

## 2012-04-28 ENCOUNTER — Other Ambulatory Visit: Payer: Self-pay | Admitting: Dermatology

## 2012-04-28 DIAGNOSIS — C44611 Basal cell carcinoma of skin of unspecified upper limb, including shoulder: Secondary | ICD-10-CM | POA: Diagnosis not present

## 2012-04-28 DIAGNOSIS — L821 Other seborrheic keratosis: Secondary | ICD-10-CM | POA: Diagnosis not present

## 2012-04-28 DIAGNOSIS — L570 Actinic keratosis: Secondary | ICD-10-CM | POA: Diagnosis not present

## 2012-04-28 DIAGNOSIS — D485 Neoplasm of uncertain behavior of skin: Secondary | ICD-10-CM | POA: Diagnosis not present

## 2012-06-23 DIAGNOSIS — Z23 Encounter for immunization: Secondary | ICD-10-CM | POA: Diagnosis not present

## 2012-10-03 DIAGNOSIS — H353190 Nonexudative age-related macular degeneration, unspecified eye, stage unspecified: Secondary | ICD-10-CM | POA: Diagnosis not present

## 2012-10-03 DIAGNOSIS — H3090 Unspecified chorioretinal inflammation, unspecified eye: Secondary | ICD-10-CM | POA: Diagnosis not present

## 2013-01-04 DIAGNOSIS — H2510 Age-related nuclear cataract, unspecified eye: Secondary | ICD-10-CM | POA: Diagnosis not present

## 2013-01-04 DIAGNOSIS — H353190 Nonexudative age-related macular degeneration, unspecified eye, stage unspecified: Secondary | ICD-10-CM | POA: Diagnosis not present

## 2013-04-25 DIAGNOSIS — Z23 Encounter for immunization: Secondary | ICD-10-CM | POA: Diagnosis not present

## 2013-04-25 DIAGNOSIS — I679 Cerebrovascular disease, unspecified: Secondary | ICD-10-CM | POA: Diagnosis not present

## 2013-04-25 DIAGNOSIS — I10 Essential (primary) hypertension: Secondary | ICD-10-CM | POA: Diagnosis not present

## 2013-04-25 DIAGNOSIS — N329 Bladder disorder, unspecified: Secondary | ICD-10-CM | POA: Diagnosis not present

## 2013-04-25 DIAGNOSIS — Z125 Encounter for screening for malignant neoplasm of prostate: Secondary | ICD-10-CM | POA: Diagnosis not present

## 2013-04-25 DIAGNOSIS — E7800 Pure hypercholesterolemia, unspecified: Secondary | ICD-10-CM | POA: Diagnosis not present

## 2013-04-26 DIAGNOSIS — I10 Essential (primary) hypertension: Secondary | ICD-10-CM | POA: Diagnosis not present

## 2013-04-27 DIAGNOSIS — L821 Other seborrheic keratosis: Secondary | ICD-10-CM | POA: Diagnosis not present

## 2013-04-27 DIAGNOSIS — L819 Disorder of pigmentation, unspecified: Secondary | ICD-10-CM | POA: Diagnosis not present

## 2013-04-27 DIAGNOSIS — D1801 Hemangioma of skin and subcutaneous tissue: Secondary | ICD-10-CM | POA: Diagnosis not present

## 2013-04-27 DIAGNOSIS — L570 Actinic keratosis: Secondary | ICD-10-CM | POA: Diagnosis not present

## 2013-06-28 DIAGNOSIS — J45909 Unspecified asthma, uncomplicated: Secondary | ICD-10-CM | POA: Diagnosis not present

## 2013-07-05 DIAGNOSIS — H2510 Age-related nuclear cataract, unspecified eye: Secondary | ICD-10-CM | POA: Diagnosis not present

## 2013-07-05 DIAGNOSIS — H353190 Nonexudative age-related macular degeneration, unspecified eye, stage unspecified: Secondary | ICD-10-CM | POA: Diagnosis not present

## 2013-10-25 ENCOUNTER — Other Ambulatory Visit: Payer: Self-pay | Admitting: Dermatology

## 2013-10-25 DIAGNOSIS — D485 Neoplasm of uncertain behavior of skin: Secondary | ICD-10-CM | POA: Diagnosis not present

## 2013-10-25 DIAGNOSIS — L570 Actinic keratosis: Secondary | ICD-10-CM | POA: Diagnosis not present

## 2013-10-25 DIAGNOSIS — L851 Acquired keratosis [keratoderma] palmaris et plantaris: Secondary | ICD-10-CM | POA: Diagnosis not present

## 2013-10-25 DIAGNOSIS — Z85828 Personal history of other malignant neoplasm of skin: Secondary | ICD-10-CM | POA: Diagnosis not present

## 2013-10-25 DIAGNOSIS — L819 Disorder of pigmentation, unspecified: Secondary | ICD-10-CM | POA: Diagnosis not present

## 2013-10-25 DIAGNOSIS — L280 Lichen simplex chronicus: Secondary | ICD-10-CM | POA: Diagnosis not present

## 2013-10-25 DIAGNOSIS — L821 Other seborrheic keratosis: Secondary | ICD-10-CM | POA: Diagnosis not present

## 2013-10-25 DIAGNOSIS — L820 Inflamed seborrheic keratosis: Secondary | ICD-10-CM | POA: Diagnosis not present

## 2013-10-25 DIAGNOSIS — D1801 Hemangioma of skin and subcutaneous tissue: Secondary | ICD-10-CM | POA: Diagnosis not present

## 2013-11-06 DIAGNOSIS — R05 Cough: Secondary | ICD-10-CM | POA: Diagnosis not present

## 2013-11-06 DIAGNOSIS — R059 Cough, unspecified: Secondary | ICD-10-CM | POA: Diagnosis not present

## 2013-12-19 DIAGNOSIS — R112 Nausea with vomiting, unspecified: Secondary | ICD-10-CM | POA: Diagnosis not present

## 2013-12-24 DIAGNOSIS — R42 Dizziness and giddiness: Secondary | ICD-10-CM | POA: Diagnosis not present

## 2014-04-29 DIAGNOSIS — H01004 Unspecified blepharitis left upper eyelid: Secondary | ICD-10-CM | POA: Diagnosis not present

## 2014-04-29 DIAGNOSIS — H01002 Unspecified blepharitis right lower eyelid: Secondary | ICD-10-CM | POA: Diagnosis not present

## 2014-04-29 DIAGNOSIS — H0289 Other specified disorders of eyelid: Secondary | ICD-10-CM | POA: Diagnosis not present

## 2014-04-29 DIAGNOSIS — H01001 Unspecified blepharitis right upper eyelid: Secondary | ICD-10-CM | POA: Diagnosis not present

## 2014-05-02 DIAGNOSIS — D1801 Hemangioma of skin and subcutaneous tissue: Secondary | ICD-10-CM | POA: Diagnosis not present

## 2014-05-02 DIAGNOSIS — Z85828 Personal history of other malignant neoplasm of skin: Secondary | ICD-10-CM | POA: Diagnosis not present

## 2014-05-02 DIAGNOSIS — L821 Other seborrheic keratosis: Secondary | ICD-10-CM | POA: Diagnosis not present

## 2014-05-02 DIAGNOSIS — L812 Freckles: Secondary | ICD-10-CM | POA: Diagnosis not present

## 2014-05-02 DIAGNOSIS — L570 Actinic keratosis: Secondary | ICD-10-CM | POA: Diagnosis not present

## 2014-05-20 DIAGNOSIS — H3531 Nonexudative age-related macular degeneration: Secondary | ICD-10-CM | POA: Diagnosis not present

## 2014-05-20 DIAGNOSIS — H2513 Age-related nuclear cataract, bilateral: Secondary | ICD-10-CM | POA: Diagnosis not present

## 2014-09-05 DIAGNOSIS — J441 Chronic obstructive pulmonary disease with (acute) exacerbation: Secondary | ICD-10-CM | POA: Diagnosis not present

## 2014-11-07 DIAGNOSIS — L570 Actinic keratosis: Secondary | ICD-10-CM | POA: Diagnosis not present

## 2014-11-07 DIAGNOSIS — Z85828 Personal history of other malignant neoplasm of skin: Secondary | ICD-10-CM | POA: Diagnosis not present

## 2014-11-07 DIAGNOSIS — D485 Neoplasm of uncertain behavior of skin: Secondary | ICD-10-CM | POA: Diagnosis not present

## 2014-11-07 DIAGNOSIS — L739 Follicular disorder, unspecified: Secondary | ICD-10-CM | POA: Diagnosis not present

## 2014-11-07 DIAGNOSIS — L821 Other seborrheic keratosis: Secondary | ICD-10-CM | POA: Diagnosis not present

## 2014-11-07 DIAGNOSIS — L720 Epidermal cyst: Secondary | ICD-10-CM | POA: Diagnosis not present

## 2014-11-14 DIAGNOSIS — R591 Generalized enlarged lymph nodes: Secondary | ICD-10-CM | POA: Diagnosis not present

## 2014-11-14 DIAGNOSIS — R05 Cough: Secondary | ICD-10-CM | POA: Diagnosis not present

## 2014-11-15 DIAGNOSIS — J320 Chronic maxillary sinusitis: Secondary | ICD-10-CM | POA: Diagnosis not present

## 2014-11-15 DIAGNOSIS — R591 Generalized enlarged lymph nodes: Secondary | ICD-10-CM | POA: Diagnosis not present

## 2014-11-15 DIAGNOSIS — J322 Chronic ethmoidal sinusitis: Secondary | ICD-10-CM | POA: Diagnosis not present

## 2014-11-20 ENCOUNTER — Other Ambulatory Visit (HOSPITAL_COMMUNITY): Payer: Self-pay | Admitting: Otolaryngology

## 2014-11-20 DIAGNOSIS — K118 Other diseases of salivary glands: Secondary | ICD-10-CM

## 2014-11-27 ENCOUNTER — Ambulatory Visit (HOSPITAL_COMMUNITY)
Admission: RE | Admit: 2014-11-27 | Discharge: 2014-11-27 | Disposition: A | Discharge status: Home / Self Care | Payer: Medicare Other | Source: Ambulatory Visit | Attending: Otolaryngology | Admitting: Otolaryngology

## 2014-11-27 DIAGNOSIS — R220 Localized swelling, mass and lump, head: Secondary | ICD-10-CM | POA: Insufficient documentation

## 2014-11-27 DIAGNOSIS — K118 Other diseases of salivary glands: Secondary | ICD-10-CM

## 2014-11-27 DIAGNOSIS — H61891 Other specified disorders of right external ear: Secondary | ICD-10-CM | POA: Diagnosis not present

## 2014-12-04 ENCOUNTER — Other Ambulatory Visit (HOSPITAL_COMMUNITY): Payer: Self-pay | Admitting: Otolaryngology

## 2014-12-04 DIAGNOSIS — K118 Other diseases of salivary glands: Secondary | ICD-10-CM | POA: Diagnosis not present

## 2014-12-04 DIAGNOSIS — L989 Disorder of the skin and subcutaneous tissue, unspecified: Secondary | ICD-10-CM

## 2014-12-09 ENCOUNTER — Other Ambulatory Visit: Payer: Self-pay | Admitting: Radiology

## 2014-12-10 ENCOUNTER — Other Ambulatory Visit: Payer: Self-pay | Admitting: Radiology

## 2014-12-11 ENCOUNTER — Ambulatory Visit (HOSPITAL_COMMUNITY)
Admission: RE | Admit: 2014-12-11 | Discharge: 2014-12-11 | Disposition: A | Discharge status: Home / Self Care | Payer: Medicare Other | Source: Ambulatory Visit | Attending: Otolaryngology | Admitting: Otolaryngology

## 2014-12-11 ENCOUNTER — Encounter (HOSPITAL_COMMUNITY): Payer: Self-pay

## 2014-12-11 DIAGNOSIS — E215 Disorder of parathyroid gland, unspecified: Secondary | ICD-10-CM | POA: Diagnosis not present

## 2014-12-11 DIAGNOSIS — K118 Other diseases of salivary glands: Secondary | ICD-10-CM | POA: Insufficient documentation

## 2014-12-11 DIAGNOSIS — Z7902 Long term (current) use of antithrombotics/antiplatelets: Secondary | ICD-10-CM | POA: Insufficient documentation

## 2014-12-11 DIAGNOSIS — I10 Essential (primary) hypertension: Secondary | ICD-10-CM | POA: Diagnosis not present

## 2014-12-11 DIAGNOSIS — Z8673 Personal history of transient ischemic attack (TIA), and cerebral infarction without residual deficits: Secondary | ICD-10-CM | POA: Diagnosis not present

## 2014-12-11 DIAGNOSIS — Z79899 Other long term (current) drug therapy: Secondary | ICD-10-CM | POA: Diagnosis not present

## 2014-12-11 DIAGNOSIS — R220 Localized swelling, mass and lump, head: Secondary | ICD-10-CM | POA: Diagnosis not present

## 2014-12-11 DIAGNOSIS — R221 Localized swelling, mass and lump, neck: Secondary | ICD-10-CM | POA: Diagnosis present

## 2014-12-11 DIAGNOSIS — D110 Benign neoplasm of parotid gland: Secondary | ICD-10-CM | POA: Insufficient documentation

## 2014-12-11 DIAGNOSIS — F17200 Nicotine dependence, unspecified, uncomplicated: Secondary | ICD-10-CM | POA: Insufficient documentation

## 2014-12-11 DIAGNOSIS — L989 Disorder of the skin and subcutaneous tissue, unspecified: Secondary | ICD-10-CM

## 2014-12-11 HISTORY — DX: Cerebral infarction, unspecified: I63.9

## 2014-12-11 HISTORY — DX: Essential (primary) hypertension: I10

## 2014-12-11 LAB — CBC
HEMATOCRIT: 48.5 % (ref 39.0–52.0)
Hemoglobin: 16.7 g/dL (ref 13.0–17.0)
MCH: 31 pg (ref 26.0–34.0)
MCHC: 34.4 g/dL (ref 30.0–36.0)
MCV: 90 fL (ref 78.0–100.0)
PLATELETS: 168 10*3/uL (ref 150–400)
RBC: 5.39 MIL/uL (ref 4.22–5.81)
RDW: 13.4 % (ref 11.5–15.5)
WBC: 7.3 10*3/uL (ref 4.0–10.5)

## 2014-12-11 LAB — PROTIME-INR
INR: 0.99 (ref 0.00–1.49)
Prothrombin Time: 13.3 seconds (ref 11.6–15.2)

## 2014-12-11 LAB — APTT: aPTT: 37 seconds (ref 24–37)

## 2014-12-11 MED ORDER — MIDAZOLAM HCL 2 MG/2ML IJ SOLN
INTRAMUSCULAR | Status: AC
  Filled 2014-12-11: qty 2

## 2014-12-11 MED ORDER — FENTANYL CITRATE (PF) 100 MCG/2ML IJ SOLN
INTRAMUSCULAR | Status: AC
  Filled 2014-12-11: qty 2

## 2014-12-11 MED ORDER — LIDOCAINE HCL (PF) 1 % IJ SOLN
INTRAMUSCULAR | Status: AC
  Filled 2014-12-11: qty 10

## 2014-12-11 MED ORDER — SODIUM CHLORIDE 0.9 % IV SOLN: Freq: Once | INTRAVENOUS | Status: DC

## 2014-12-11 NOTE — Procedures (Signed)
Interventional Radiology Procedure Note  Procedure:  US guided biopsy of 2 right parotid mass.  1st:  Superior.  3 x 18G core 2nd: Inferior.  3 x 47V core Complications: None Recommendations:  - Ok to shower tomorrow   - Routine care   Signed,  Dulcy Fanny. Earleen Newport, DO

## 2014-12-11 NOTE — H&P (Signed)
Chief Complaint: L parotid mass  Referring Physician(s): Crossley,James J  History of Present Illness: Billy [name] is a 69 y.o. male   Noticed L parotid masses at least 2 yrs ago Have not changed No pain Decided to see MD when family asked him to Every day smoker US reveals 2 areas in L parotid that are abnormal Now scheduled for biopsy of both Hx CVA 2 -3 yrs ago---on Plavix daily Last dose 9pm last night  Past Medical History  Diagnosis Date  . Hypertension   . Stroke     History reviewed. No pertinent past surgical history.  Allergies: Morphine and related  Medications: Prior to Admission medications   Medication Sig Start Date End Date Taking? Authorizing Provider  amLODipine (NORVASC) 5 MG tablet Take 5 mg by mouth at bedtime.   Yes Historical Provider, MD  clopidogrel (PLAVIX) 75 MG tablet Take 75 mg by mouth at bedtime.  10/07/14  Yes Historical Provider, MD  ibuprofen (ADVIL,MOTRIN) 200 MG tablet Take 400 mg by mouth at bedtime as needed (pain).   Yes Historical Provider, MD  Multiple Vitamin (MULTIVITAMIN WITH MINERALS) TABS tablet Take 1 tablet by mouth at bedtime.   Yes Historical Provider, MD  Multiple Vitamins-Minerals (PRESERVISION AREDS 2) CAPS Take 1 capsule by mouth at bedtime.   Yes Historical Provider, MD  niacin 500 MG CR capsule Take 1,000 mg by mouth at bedtime.   Yes Historical Provider, MD  tamsulosin (FLOMAX) 0.4 MG CAPS capsule Take 0.8 mg by mouth at bedtime. 09/05/14  Yes Historical Provider, MD     History reviewed. No pertinent family history.  History   Social History  . Marital Status: Widowed    Spouse Name: N/A  . Number of Children: N/A  . Years of Education: N/A   Social History Main Topics  . Smoking status: Current Every Day Smoker  . Smokeless tobacco: Not on file  . Alcohol Use: Not on file  . Drug Use: Not on file  . Sexual Activity: Not on file   Other Topics Concern  . None   Social History Narrative  .  None     Review of Systems: A 12 point ROS discussed and pertinent positives are indicated in the HPI above.  All other systems are negative.  Review of Systems  Constitutional: Negative for fever and activity change.  HENT: Negative for facial swelling and sore throat.   Respiratory: Negative for cough and shortness of breath.   Cardiovascular: Negative for chest pain.  Psychiatric/Behavioral: Negative for behavioral problems and confusion.    Vital Signs: BP 120/83 mmHg  Pulse 82  Temp(Src) 98.3 F (36.8 C) (Oral)  Resp 17  Ht 5' 10.5" (1.791 m)  Wt 200 lb (90.719 kg)  BMI 28.28 kg/m2  SpO2 97%  Physical Exam  Constitutional: He is oriented to person, place, and time.  Cardiovascular: Normal rate and regular rhythm.   Pulmonary/Chest: Effort normal and breath sounds normal. He has no wheezes.  Abdominal: Soft. There is no tenderness.  Musculoskeletal: Normal range of motion.  Neurological: He is alert and oriented to person, place, and time.  Skin: Skin is warm and dry.  Psychiatric: He has a normal mood and affect. His behavior is normal. Judgment and thought content normal.  Nursing note and vitals reviewed.   Mallampati Score:  MD Evaluation Airway: WNL Heart: WNL Abdomen: WNL Chest/ Lungs: WNL ASA  Classification: 3 Mallampati/Airway Score: One  Imaging: US Soft Tissue Head/neck  11/27/2014   CLINICAL DATA:  Evaluate right parotid mass. Palpable area for 2-3 years.  EXAM: ULTRASOUND OF HEAD/NECK SOFT TISSUES  TECHNIQUE: Ultrasound examination of the head and neck soft tissues was performed in the area of clinical concern.  COMPARISON:  None.  FINDINGS: There is a well-defined hypoechoic lesion on the right neck, located below the right ear. This lesion measures 2.7 x 1.4 x 1.6 cm. Small amount of vascular flow within this solid lesion. Reportedly, this lesion is not palpable. There is a second lesion which is anterior to the right ear and this lesion is palpable.  The palpable lesion measures 3.0 x 1.4 x 2.3 cm. Based on the vascular flow pattern, this structure may have a hilum and this could represent an enlarged lymph node. Both of these structures are surrounded by hyperechoic soft tissue which could represent parotid tissue. The echogenicity of both lesions are similar.  IMPRESSION: There are two similar appearing hypoechoic lesions near the right ear and these are probably within the right parotid tissue. These could represent enlarged intra-parotid lymph nodes but cannot exclude primary parotid lesions.   Electronically Signed   By: Markus Daft M.D.   On: 11/27/2014 11:51    Labs:  CBC: No results for input(s): WBC, HGB, HCT, PLT in the last 8760 hours.  COAGS: No results for input(s): INR, APTT in the last 8760 hours.  BMP: No results for input(s): NA, K, CL, CO2, GLUCOSE, BUN, CALCIUM, CREATININE, GFRNONAA, GFRAA in the last 8760 hours.  Invalid input(s): CMP  LIVER FUNCTION TESTS: No results for input(s): BILITOT, AST, ALT, ALKPHOS, PROT, ALBUMIN in the last 8760 hours.  TUMOR MARKERS: No results for input(s): AFPTM, CEA, CA199, CHROMGRNA in the last 8760 hours.  Assessment and Plan:  L parotid masses x 2 Smoker Scheduled now for bx per Dr Ernesto Rutherford Plavix daily--LD last pm Risks and Benefits discussed with the patient including, but not limited to bleeding, infection, damage to adjacent structures or low yield requiring additional tests. All of the patient's questions were answered, patient is agreeable to proceed. Consent signed and in chart.   Thank you for this interesting consult.  I greatly enjoyed meeting Billy [name] and look forward to participating in their care.  Signed: Skyy Mcknight A 12/11/2014, 1:49 PM   I spent a total of  20 Minutes   in face to face in clinical consultation, greater than 50% of which was counseling/coordinating care for L parotid masses bx

## 2014-12-11 NOTE — Progress Notes (Signed)
Discharged home ambulatory, bandaid to L neck d/i, sent home with ice pack and instructions for tylenol prn for pain

## 2014-12-16 ENCOUNTER — Telehealth: Payer: Self-pay | Admitting: Neurology

## 2014-12-16 ENCOUNTER — Telehealth: Payer: Self-pay | Admitting: *Deleted

## 2014-12-16 DIAGNOSIS — D110 Benign neoplasm of parotid gland: Secondary | ICD-10-CM | POA: Diagnosis not present

## 2014-12-16 NOTE — Telephone Encounter (Signed)
error 

## 2014-12-16 NOTE — Telephone Encounter (Signed)
Left detailed VM for pt to call back and schedule NP appt with Dr. Jaynee Eagles. We can put him in a 4pm slot. Just let me know and I can schedule him. Gave [facility] phone number and office hours. Told him Dr. Jaynee Eagles and I are not here on Fridays.

## 2014-12-18 ENCOUNTER — Other Ambulatory Visit: Payer: Self-pay | Admitting: Neurology

## 2014-12-18 ENCOUNTER — Encounter: Payer: Self-pay | Admitting: Neurology

## 2014-12-18 ENCOUNTER — Telehealth: Payer: Self-pay | Admitting: Cardiovascular Disease

## 2014-12-18 ENCOUNTER — Ambulatory Visit (INDEPENDENT_AMBULATORY_CARE_PROVIDER_SITE_OTHER): Payer: Medicare Other | Admitting: Neurology

## 2014-12-18 VITALS — BP 132/78 | HR 86 | Resp 20 | Ht 70.5 in | Wt 199.0 lb

## 2014-12-18 DIAGNOSIS — I10 Essential (primary) hypertension: Secondary | ICD-10-CM | POA: Diagnosis not present

## 2014-12-18 DIAGNOSIS — I6381 Other cerebral infarction due to occlusion or stenosis of small artery: Secondary | ICD-10-CM

## 2014-12-18 DIAGNOSIS — I638 Other cerebral infarction: Secondary | ICD-10-CM | POA: Diagnosis not present

## 2014-12-18 DIAGNOSIS — I639 Cerebral infarction, unspecified: Secondary | ICD-10-CM | POA: Diagnosis not present

## 2014-12-18 DIAGNOSIS — E785 Hyperlipidemia, unspecified: Secondary | ICD-10-CM | POA: Diagnosis not present

## 2014-12-18 NOTE — Progress Notes (Unsigned)
[facility]    Provider:  Dr Jaynee Eagles Referring Provider: No ref. provider found Primary Care Physician:   Melinda Crutch, MD  CC:  ***  HPI:  Billy [name] is a 69 y.o. male here as a referral from Dr. No ref. provider found for ***.  ***  He has a history of thalamic stroke, and now he has to have parotid removal. Stroke wasin 2010, shaving and noticed right hand weakness. He went to the emergency room and he drove himself. Taking plavix daily. No new numbness, tingling. He has no weakness. Fine finger movements ok. No speech problems, he has memory loss. No dysphagia. He plays a lot of golf. No falls, he is very active. He declines MRi of the brain. No issues whatsoever. He lives alone. Recommend lipid panel. Will recommend to primary care and start statin if >70, also to check HgbA1c.   Reviewed notes, labs and imaging from outside physicians, which showed ***  Review of Systems: Patient complains of symptoms per HPI as well as the following symptoms ***. Pertinent negatives per HPI. All others negative.   History   Social History  . Marital Status: Widowed    Spouse Name: N/A  . Number of Children: N/A  . Years of Education: N/A   Occupational History  . retired    Social History Main Topics  . Smoking status: Current Every Day Smoker -- 0.50 packs/day for 10 years    Types: Cigarettes  . Smokeless tobacco: Not on file  . Alcohol Use: No  . Drug Use: No  . Sexual Activity: Not on file   Other Topics Concern  . Not on file   Social History Narrative   Wife is deceased.   Retired.    Family History  Problem Relation Age of Onset  . Breast cancer Mother   . Cirrhosis Father     Past Medical History  Diagnosis Date  . Hypertension   . Stroke     No past surgical history on file.  Current Outpatient Prescriptions  Medication Sig Dispense Refill  . amLODipine (NORVASC) 5 MG tablet Take 5 mg by mouth at bedtime.    . cefUROXime (CEFTIN)  250 MG tablet     . clopidogrel (PLAVIX) 75 MG tablet Take 75 mg by mouth at bedtime.   1  . ibuprofen (ADVIL,MOTRIN) 200 MG tablet Take 400 mg by mouth at bedtime as needed (pain).    . Multiple Vitamin (MULTIVITAMIN WITH MINERALS) TABS tablet Take 1 tablet by mouth at bedtime.    . Multiple Vitamins-Minerals (PRESERVISION AREDS 2) CAPS Take 1 capsule by mouth at bedtime.    . niacin 500 MG CR capsule Take 1,000 mg by mouth at bedtime.    . tamsulosin (FLOMAX) 0.4 MG CAPS capsule Take 0.8 mg by mouth at bedtime.  1   No current facility-administered medications for this visit.    Allergies as of 12/18/2014 - Review Complete 12/18/2014  Allergen Reaction Noted  . Morphine and related Nausea And Vomiting 12/09/2014    Vitals: There were no vitals taken for this visit. Last Weight:  Wt Readings from Last 1 Encounters:  12/18/14 199 lb (90.266 kg)   Last Height:   Ht Readings from Last 1 Encounters:  12/18/14 5' 10.5" (1.791 m)    Absent patellars     Assessment/Plan:    Sarina Ill, MD  [facility] [address] [place], [place] [address]  Phone [phone] Fax [phone]  A total of *** minutes was spent face-to-face with this patient. Over half this time was spent on counseling patient on the *** diagnosis and different diagnostic and therapeutic options available.

## 2014-12-18 NOTE — Telephone Encounter (Signed)
Received records from Dr Minna Merritts for appointment on 01/21/15 with Dr Gwenlyn Found.  Records given to [facility] (medical records) for Dr Kennon Holter schedule on 01/21/15. lp

## 2014-12-23 NOTE — Progress Notes (Signed)
[facility]    Provider:  Dr Jaynee Eagles Referring Provider: Lona Kettle, MD Primary Care Physician:   Melinda Crutch, MD  CC:  Stroke f/u  HPI:  Billy [name] is a 69 y.o. male here as a referral from Dr. Harrington Challenger for stroke f/u. He has a PMHx of HTN, tobacco abuse, HLD, mesothelioma status post  resection approximately 2000, cluster headaches.  Per medical records, Patient presented on 08/24/2007 to the emergency room for double vision and staggering gait and he was noted to have incomplete abduction of the right eye with left lateral gaze and prominent nystagmus with rotary component and upbeat nystagmus particularly with the left eye. The patient had no change in speech or swallowing at that time but did have a staggering gait. NIH stroke scale at the time was 1. He did not receive tPA due to late onset of presentation.  at that time he was on aspirin 81 mg daily. MRI revealed paramedian midbrain infarct. He presented again 4 days later due to acute onset slurred speech and right-sided extremity and right sided lower facial weakness, difficulty walking. MRI of the brain showed a new left thalamic infarct. His symptoms resolved while in the hospital. He was switched to Plavix. Stroke etiology due to small-vessel disease. In 2010 he presented with new onset of difficulty using his right upper extremity and coordination problems of his right leg when walking. CT scan of his head showed old left thalamic infarct and no new lesion.On exam, coordination of his right upper extremity was slightly impaired with finger to nose testing. MRI of the brain did not show etiology however questionable right pontine/cerebellar infarct that could not be seen in an MRI was theorized versus TIA.  He plays golf regularly, he is very active, He denies any weakness, memory loss, aphasia or dysarthria, paresthesias or numbness, vision changes, hearing changes, weakness, ataxia, dysmetria. He denies any stroke-like  symptoms since his cerebrovascular events in 2009 and 2010. He endorses compliance with medications.  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 He is here for follow-up of strokes in 2009/2010. All symptoms resolved, no sequelae and no new symptoms since.   MRI brain 08/28/2007: Findings: Thin section diffusion imaging demonstrates two small acute infarcts not seen on prior diffusion-weighted sequence. This includes a tiny infarct of the right posterior pons immediately anterior to the aqueduct/fourth ventricle junction. There is also suggestion of a tiny infarct of the right upper pons. Floor contatcted. IMPRESSION:Two tiny infarcts suspected as described above.  MRI brain 08/29/2007: Findings: Again seen is a 2-mm focus of diffusion-positive signal along the floor of the fourth ventricle affecting the dorsal pons. There is a new 1.7-cm stroke affecting the left thalamus. There is no sign of hemorrhage, swelling, mass effect, or shift related to this. The remainder of the brain appears unremarkable. There seems to be flow in both carotid arteries and in the left vertebral artery. I question if there is an abnormal flow pattern in the right vertebral artery, but that is not a definite finding. No hydrocephalus or extraaxial collection. No significant sinus disease.  IMPRESSION: New acute stroke measuring 1.7 cm in size affecting the left thalamus. See above.   CT 05/2009: Findings: Old infarct noted in the left thalamus. No visualized acute infarction by CT. No hemorrhage, mass effect, hydrocephalus or extra-axial fluid identified. No evidence of mass lesion. Skull is unremarkable. Mucosal thickening present in bilateral ethmoid air cells.  IMPRESSION: Old left thalamic infarction. No acute  findings by CT.  Imaging 05/2009:  MRI HEAD 05/2009  Findings: Diffusion imaging does not show any acute or subacute infarction. The brainstem and cerebellum appear  unremarkable. There is an old infarction affecting the left thalamus. The cerebral hemispheres otherwise appear normal. No mass lesion, hemorrhage, hydrocephalus or extra-axial collection. No pituitary mass. Sinuses, middle ears and mastoids are clear. There is a 13 mm rounded entity in the parotid gland on the right that was present in March 2009 but was smaller, measuring 10 mm. This could be a lymph node but a mass is not excluded. Evaluation of this is suggested at some point.  IMPRESSION: No acute brain abnormality. Old thalamic stroke on the left.  13 mm rounded entity within the parotid gland on the right. This could be a lymph node or a mass. It measured 1 cm in size and March 2009.  MRA HEAD 05/2009  Findings: Both internal carotid arteries widely patent into the brain. The anterior middle cerebral vessels appear normal without proximal stenosis, aneurysm or vascular malformation. I do not see a middle cerebral artery aneurysm on today's exam as questioned previously. Both vertebral arteries are patent to the basilar. No basilar stenosis. Posterior circulation branch vessels appear normal.  IMPRESSION: Normal intracranial MR angiography of the large and medium-sized vessels.  MRA NECK 05/2009  Findings: Brachiocephalic vessel origins from the arch of poorly evaluated because of physiologic motion.  Both common carotid arteries are widely patent to their respective bifurcation. No stenosis or irregularity. Both cervical internal carotid arteries appear normal.  Both vertebral arteries are patent to the basilar. The origins are not well seen because of artifact. I think the right vertebral artery origin is normal. There may be mild narrowing of the left vertebral artery origin.  IMPRESSION: No carotid bifurcation disease. Anterior circulation has a normal appearance.  Both vertebral arteries appear normal with exception  of a possible stenosis of the left vertebral artery origin of a mild to moderate degree.  Brachiocephalic vessel origins from the arch are poorly seen because of physiologic motion in the chest.  Reviewed notes, labs and imaging from outside physicians, which showed: Cbc 11/2014 nml,  hgba1c in 2010 5.6, ldl 28 in 2010,   Echo 2010: Mild basal septal hypertrophy The cavity size was normal. Systolic function was normal. The estimated ejection fraction was in the range of 55% to 60%. Wall motion was normal; there were no regional wall motion abnormalities.  Personally reviewed images of the brain  Review of Systems: Patient complains of symptoms per HPI as well as the following symptoms: No CP, no SOB, no fever, no chills, no recent illnesses. Pertinent negatives per HPI. All others negative.   History   Social History  . Marital Status: Widowed    Spouse Name: N/A  . Number of Children: N/A  . Years of Education: N/A   Occupational History  . retired    Social History Main Topics  . Smoking status: Current Every Day Smoker -- 0.50 packs/day for 10 years    Types: Cigarettes  . Smokeless tobacco: Not on file  . Alcohol Use: No  . Drug Use: No  . Sexual Activity: Not on file   Other Topics Concern  . Not on file   Social History Narrative   Wife is deceased.   Retired.    Family History  Problem Relation Age of Onset  . Breast cancer Mother   . Cirrhosis Father     Past Medical History  Diagnosis Date  . Hypertension   . Stroke     No past surgical history on file.  Current Outpatient Prescriptions  Medication Sig Dispense Refill  . amLODipine (NORVASC) 5 MG tablet Take 5 mg by mouth at bedtime.    . cefUROXime (CEFTIN) 250 MG tablet     . clopidogrel (PLAVIX) 75 MG tablet Take 75 mg by mouth at bedtime.   1  . ibuprofen (ADVIL,MOTRIN) 200 MG tablet Take 400 mg by mouth at bedtime as needed (pain).    . Multiple Vitamin (MULTIVITAMIN WITH  MINERALS) TABS tablet Take 1 tablet by mouth at bedtime.    . Multiple Vitamins-Minerals (PRESERVISION AREDS 2) CAPS Take 1 capsule by mouth at bedtime.    . niacin 500 MG CR capsule Take 1,000 mg by mouth at bedtime.    . tamsulosin (FLOMAX) 0.4 MG CAPS capsule Take 0.8 mg by mouth at bedtime.  1   No current facility-administered medications for this visit.    Allergies as of 12/18/2014 - Review Complete 12/18/2014  Allergen Reaction Noted  . Morphine and related Nausea And Vomiting 12/09/2014    Vitals: BP 132/78 mmHg  Pulse 86  Resp 20  Ht 5' 10.5" (1.791 m)  Wt 199 lb (90.266 kg)  BMI 28.14 kg/m2 Last Weight:  Wt Readings from Last 1 Encounters:  12/18/14 199 lb (90.266 kg)   Last Height:   Ht Readings from Last 1 Encounters:  12/18/14 5' 10.5" (1.791 m)   Physical exam: Exam: Gen: NAD, conversant, well nourised, well groomed                     CV: RRR, no MRG. No Carotid Bruits. No peripheral edema, warm, nontender Eyes: Conjunctivae clear without exudates or hemorrhage  Neuro: Detailed Neurologic Exam  Speech:    Speech is normal; fluent and spontaneous with normal comprehension.  Cognition:    The patient is oriented to person, place, and time;     recent and remote memory intact;     language fluent;     normal attention, concentration,     fund of knowledge Cranial Nerves:    The pupils are equal, round, and reactive to light. The fundi are flat. Visual fields are full to finger confrontation. Extraocular movements are intact. Trigeminal sensation is intact and the muscles of mastication are normal. The face is symmetric. The palate elevates in the midline. Hearing intact. Voice is normal. Shoulder shrug is normal. The tongue has normal motion without fasciculations.   Coordination:    Normal finger to nose and heel to shin. Normal rapid alternating movements.   Gait:    Heel-toe and tandem gait are normal.   Motor Observation:    No asymmetry, no  atrophy, and no involuntary movements noted. Tone:    Normal muscle tone.    Posture:    Posture is normal. normal erect    Strength:    Strength is V/V in the upper and lower limbs.      Sensation: intact to LT     Reflex Exam:  DTR's:    Deep tendon reflexes in the upper and lower extremities Symmetrical bilaterally.   Toes:    The toes are downgoing bilaterally.   Clonus:    Clonus is absent.       Assessment/Plan:  69 y.o. male here as a referral from Dr. Harrington Challenger for stroke f/u. He has a PMHx of HTN, tobacco abuse, HLD, mesothelioma, cluster headaches. He  was Dxed with a paramedian midbrain and left thalamic infarct in 2009 after acute onset diplopia, dysarthria, right-sided weakness and numbness. Aspirin was changed to Plavix.  Stroke etiology due to small-vessel disease.   In 2010 he presented with new onset of difficulty using his right upper extremity and coordination problems of his right leg when walking. MRI of the brain did not show etiology however questionable right pontine/cerebellar infarct that could not be seen in an MRI was theorized versus TIA.  He denies any stroke-like symptoms since 2010, he is very active and plays golf regularly. He endorses compliance with plavix. He does not take a statin an declines starting one. neurologic exam was nonfocal.   Discussed  ordering a current MRI of the brain for evaluation of previous infarcts and surveillance for any new lesions given his past medical history and vascular risk factors. He declines further workup and given nonfocal neurologic exam without complaints, can just follow patient clinically.I do recommend close f/u with his pcp for management of vascular risk factors.  He should maintain strict control of hypertension, systolic blood pressure below 130 and and LDL below 70. He was counseled to not to smoke and maintain compliance with his Plavix.  I highly recommend cholesterol testing and restarting Lipitor. F/u as  needed.   Sarina Ill, MD  [facility] [address] [place], [place] [address]  Phone [phone] Fax [phone]

## 2014-12-24 ENCOUNTER — Encounter: Payer: Self-pay | Admitting: Neurology

## 2014-12-24 DIAGNOSIS — E785 Hyperlipidemia, unspecified: Secondary | ICD-10-CM | POA: Insufficient documentation

## 2014-12-24 DIAGNOSIS — I10 Essential (primary) hypertension: Secondary | ICD-10-CM | POA: Insufficient documentation

## 2014-12-24 DIAGNOSIS — I639 Cerebral infarction, unspecified: Secondary | ICD-10-CM | POA: Insufficient documentation

## 2014-12-24 DIAGNOSIS — I6381 Other cerebral infarction due to occlusion or stenosis of small artery: Secondary | ICD-10-CM | POA: Insufficient documentation

## 2015-01-01 ENCOUNTER — Telehealth: Payer: Self-pay | Admitting: Neurology

## 2015-01-01 ENCOUNTER — Other Ambulatory Visit: Payer: Self-pay | Admitting: Neurology

## 2015-01-01 DIAGNOSIS — I639 Cerebral infarction, unspecified: Secondary | ICD-10-CM

## 2015-01-01 DIAGNOSIS — H532 Diplopia: Secondary | ICD-10-CM

## 2015-01-01 DIAGNOSIS — D110 Benign neoplasm of parotid gland: Secondary | ICD-10-CM | POA: Diagnosis not present

## 2015-01-01 NOTE — Telephone Encounter (Signed)
Mri of the brain ordered, thanks!

## 2015-01-01 NOTE — Telephone Encounter (Signed)
Patient called requesting an MRI BRAIN order that was discussed on his last office visit but declined to have it done then. Please advise when the order is ready, so I can send it to [place]. Patient can be reached @ [phone]

## 2015-01-10 ENCOUNTER — Ambulatory Visit
Admission: RE | Admit: 2015-01-10 | Discharge: 2015-01-10 | Disposition: A | Discharge status: Home / Self Care | Payer: Medicare Other | Source: Ambulatory Visit | Attending: Neurology | Admitting: Neurology

## 2015-01-10 DIAGNOSIS — H532 Diplopia: Secondary | ICD-10-CM | POA: Diagnosis not present

## 2015-01-10 DIAGNOSIS — Z8673 Personal history of transient ischemic attack (TIA), and cerebral infarction without residual deficits: Secondary | ICD-10-CM | POA: Diagnosis not present

## 2015-01-10 DIAGNOSIS — I639 Cerebral infarction, unspecified: Secondary | ICD-10-CM | POA: Diagnosis not present

## 2015-01-10 DIAGNOSIS — J324 Chronic pansinusitis: Secondary | ICD-10-CM | POA: Diagnosis not present

## 2015-01-14 ENCOUNTER — Telehealth: Payer: Self-pay | Admitting: *Deleted

## 2015-01-14 NOTE — Telephone Encounter (Signed)
Left detailed VM about MRI brain results. Told pt that other than his old stroke, his MRI showed no new strokes. MRI showed chronic sinusitis (inflammation in sinus cavities) and he had some white matter changes which were likely d/t HTN or other vascular changes. He should f/u w/ PCP about this. Otherwise, MRI showed no new strokes since 2010. Told him to call back if he had further questions. Gave [facility] phone number and office hours.

## 2015-01-21 ENCOUNTER — Ambulatory Visit (INDEPENDENT_AMBULATORY_CARE_PROVIDER_SITE_OTHER): Payer: Medicare Other | Admitting: Cardiovascular Disease

## 2015-01-21 ENCOUNTER — Encounter: Payer: Self-pay | Admitting: Cardiovascular Disease

## 2015-01-21 VITALS — BP 124/78 | HR 84 | Ht 70.5 in | Wt 197.6 lb

## 2015-01-21 DIAGNOSIS — I638 Other cerebral infarction: Secondary | ICD-10-CM | POA: Diagnosis not present

## 2015-01-21 DIAGNOSIS — I10 Essential (primary) hypertension: Secondary | ICD-10-CM | POA: Diagnosis not present

## 2015-01-21 NOTE — Progress Notes (Signed)
01/21/2015 Billy [name]   1946/03/10  [id number]  Primary Physician  Melinda Crutch, MD Primary Cardiologist: Lorretta Harp MD Renae Gloss   HPI:  Billy [name] is a 69 year old mildly overweight widowed Caucasian male no children who is a retired Government social research officer referred by Dr. Ernesto Rutherford for cardiovascular clearance before removal of what sounds like parotid tumors. His cardiovascular risk factor profile is notable for 30 30-pack-years of tobacco abuse currently smoking a pack a day. He's never had a heart attack but has had several TIAs in the past with no neurologic residual. He has treated hypertension. He denies chest pain or shortness of breath. He clearly had a nuclear stress test 5 years ago by Dr. Radford Pax which was normal. At this point, given his lack of symptoms, the nature of the procedure being low risk I do not feel compelled to perform another stress test on him. I'm clearing him at low risk.   Current Outpatient Prescriptions  Medication Sig Dispense Refill  . amLODipine (NORVASC) 5 MG tablet Take 5 mg by mouth at bedtime.    . clopidogrel (PLAVIX) 75 MG tablet Take 75 mg by mouth at bedtime.   1  . ibuprofen (ADVIL,MOTRIN) 200 MG tablet Take 400 mg by mouth at bedtime as needed (pain).    . Multiple Vitamin (MULTIVITAMIN WITH MINERALS) TABS tablet Take 1 tablet by mouth at bedtime.    . Multiple Vitamins-Minerals (PRESERVISION AREDS 2) CAPS Take 1 capsule by mouth at bedtime.    . niacin 500 MG CR capsule Take 1,000 mg by mouth at bedtime.    . tamsulosin (FLOMAX) 0.4 MG CAPS capsule Take 0.8 mg by mouth at bedtime.  1   No current facility-administered medications for this visit.    Allergies  Allergen Reactions  . Morphine And Related Nausea And Vomiting    History   Social History  . Marital Status: Widowed    Spouse Name: N/A  . Number of Children: N/A  . Years of Education: N/A   Occupational History  . retired    Social History Main  Topics  . Smoking status: Current Every Day Smoker -- 0.50 packs/day for 10 years    Types: Cigarettes  . Smokeless tobacco: Not on file  . Alcohol Use: No  . Drug Use: No  . Sexual Activity: Not on file   Other Topics Concern  . Not on file   Social History Narrative   Wife is deceased.   Retired.     Review of Systems: General: negative for chills, fever, night sweats or weight changes.  Cardiovascular: negative for chest pain, dyspnea on exertion, edema, orthopnea, palpitations, paroxysmal nocturnal dyspnea or shortness of breath Dermatological: negative for rash Respiratory: negative for cough or wheezing Urologic: negative for hematuria Abdominal: negative for nausea, vomiting, diarrhea, bright red blood per rectum, melena, or hematemesis Neurologic: negative for visual changes, syncope, or dizziness All other systems reviewed and are otherwise negative except as noted above.    Blood pressure 124/78, pulse 84, height 5' 10.5" (1.791 m), weight 197 lb 9.6 oz (89.631 kg).  General appearance: alert and no distress Neck: no adenopathy, no carotid bruit, no JVD, supple, symmetrical, trachea midline and thyroid not enlarged, symmetric, no tenderness/mass/nodules Lungs: clear to auscultation bilaterally Heart: regular rate and rhythm, S1, S2 normal, no murmur, click, rub or gallop Extremities: extremities normal, atraumatic, no cyanosis or edema  EKG normal sinus rhythm at 77 with occasional PVC. I personally reviewed  this EKG  ASSESSMENT AND PLAN:   HTN (hypertension) History of hypertension with blood pressure measured at 124/78. He is on amlodipine. Continue current meds at current dosing      Lorretta Harp MD [facility], [facility] 01/21/2015 3:35 PM

## 2015-01-21 NOTE — Assessment & Plan Note (Signed)
History of hypertension with blood pressure measured at 124/78. He is on amlodipine. Continue current meds at current dosing

## 2015-01-21 NOTE — Patient Instructions (Signed)
Your physician recommends that you schedule a follow-up appointment as needed with Dr. Gwenlyn Found   Dr. Gwenlyn Found has cleared you for your head & neck surgery - low risk

## 2015-01-27 DIAGNOSIS — D110 Benign neoplasm of parotid gland: Secondary | ICD-10-CM | POA: Diagnosis not present

## 2015-01-29 DIAGNOSIS — F17200 Nicotine dependence, unspecified, uncomplicated: Secondary | ICD-10-CM | POA: Diagnosis not present

## 2015-01-29 DIAGNOSIS — R220 Localized swelling, mass and lump, head: Secondary | ICD-10-CM | POA: Diagnosis not present

## 2015-01-29 DIAGNOSIS — D110 Benign neoplasm of parotid gland: Secondary | ICD-10-CM | POA: Diagnosis not present

## 2015-02-03 DIAGNOSIS — D110 Benign neoplasm of parotid gland: Secondary | ICD-10-CM | POA: Diagnosis not present

## 2015-02-27 DIAGNOSIS — D110 Benign neoplasm of parotid gland: Secondary | ICD-10-CM | POA: Diagnosis not present

## 2015-05-12 DIAGNOSIS — L821 Other seborrheic keratosis: Secondary | ICD-10-CM | POA: Diagnosis not present

## 2015-05-12 DIAGNOSIS — L853 Xerosis cutis: Secondary | ICD-10-CM | POA: Diagnosis not present

## 2015-05-12 DIAGNOSIS — Z85828 Personal history of other malignant neoplasm of skin: Secondary | ICD-10-CM | POA: Diagnosis not present

## 2015-05-12 DIAGNOSIS — L570 Actinic keratosis: Secondary | ICD-10-CM | POA: Diagnosis not present

## 2015-05-12 DIAGNOSIS — C44722 Squamous cell carcinoma of skin of right lower limb, including hip: Secondary | ICD-10-CM | POA: Diagnosis not present

## 2015-05-12 DIAGNOSIS — D485 Neoplasm of uncertain behavior of skin: Secondary | ICD-10-CM | POA: Diagnosis not present

## 2015-05-12 DIAGNOSIS — D1801 Hemangioma of skin and subcutaneous tissue: Secondary | ICD-10-CM | POA: Diagnosis not present

## 2015-05-12 DIAGNOSIS — L309 Dermatitis, unspecified: Secondary | ICD-10-CM | POA: Diagnosis not present

## 2015-05-12 DIAGNOSIS — L723 Sebaceous cyst: Secondary | ICD-10-CM | POA: Diagnosis not present

## 2015-06-26 ENCOUNTER — Encounter (HOSPITAL_COMMUNITY): Payer: Self-pay | Admitting: Neurology

## 2015-06-26 ENCOUNTER — Inpatient Hospital Stay (HOSPITAL_COMMUNITY)
Admission: EM | Admit: 2015-06-26 | Discharge: 2015-06-27 | DRG: 066 | Disposition: A | Discharge status: Home / Self Care | Payer: Medicare Other | Attending: Internal Medicine | Admitting: Internal Medicine

## 2015-06-26 ENCOUNTER — Emergency Department (HOSPITAL_COMMUNITY): Payer: Medicare Other

## 2015-06-26 ENCOUNTER — Inpatient Hospital Stay (HOSPITAL_COMMUNITY): Payer: Medicare Other

## 2015-06-26 DIAGNOSIS — F17210 Nicotine dependence, cigarettes, uncomplicated: Secondary | ICD-10-CM | POA: Diagnosis present

## 2015-06-26 DIAGNOSIS — I63511 Cerebral infarction due to unspecified occlusion or stenosis of right middle cerebral artery: Secondary | ICD-10-CM | POA: Diagnosis not present

## 2015-06-26 DIAGNOSIS — Z885 Allergy status to narcotic agent status: Secondary | ICD-10-CM | POA: Diagnosis not present

## 2015-06-26 DIAGNOSIS — I6789 Other cerebrovascular disease: Secondary | ICD-10-CM | POA: Diagnosis not present

## 2015-06-26 DIAGNOSIS — R739 Hyperglycemia, unspecified: Secondary | ICD-10-CM | POA: Diagnosis present

## 2015-06-26 DIAGNOSIS — E785 Hyperlipidemia, unspecified: Secondary | ICD-10-CM | POA: Diagnosis present

## 2015-06-26 DIAGNOSIS — K118 Other diseases of salivary glands: Secondary | ICD-10-CM | POA: Diagnosis present

## 2015-06-26 DIAGNOSIS — Z8673 Personal history of transient ischemic attack (TIA), and cerebral infarction without residual deficits: Secondary | ICD-10-CM | POA: Diagnosis not present

## 2015-06-26 DIAGNOSIS — I639 Cerebral infarction, unspecified: Secondary | ICD-10-CM | POA: Diagnosis present

## 2015-06-26 DIAGNOSIS — I6350 Cerebral infarction due to unspecified occlusion or stenosis of unspecified cerebral artery: Principal | ICD-10-CM | POA: Diagnosis present

## 2015-06-26 DIAGNOSIS — I10 Essential (primary) hypertension: Secondary | ICD-10-CM | POA: Diagnosis not present

## 2015-06-26 DIAGNOSIS — R531 Weakness: Secondary | ICD-10-CM | POA: Diagnosis not present

## 2015-06-26 DIAGNOSIS — I63311 Cerebral infarction due to thrombosis of right middle cerebral artery: Secondary | ICD-10-CM | POA: Insufficient documentation

## 2015-06-26 DIAGNOSIS — R471 Dysarthria and anarthria: Secondary | ICD-10-CM | POA: Diagnosis present

## 2015-06-26 DIAGNOSIS — R4781 Slurred speech: Secondary | ICD-10-CM | POA: Diagnosis not present

## 2015-06-26 DIAGNOSIS — Z7982 Long term (current) use of aspirin: Secondary | ICD-10-CM

## 2015-06-26 DIAGNOSIS — R29810 Facial weakness: Secondary | ICD-10-CM | POA: Diagnosis present

## 2015-06-26 DIAGNOSIS — R220 Localized swelling, mass and lump, head: Secondary | ICD-10-CM | POA: Diagnosis not present

## 2015-06-26 LAB — COMPREHENSIVE METABOLIC PANEL
ALBUMIN: 4.2 g/dL (ref 3.5–5.0)
ALT: 17 U/L (ref 17–63)
AST: 22 U/L (ref 15–41)
Alkaline Phosphatase: 70 U/L (ref 38–126)
Anion gap: 11 (ref 5–15)
BILIRUBIN TOTAL: 0.7 mg/dL (ref 0.3–1.2)
BUN: 9 mg/dL (ref 6–20)
CALCIUM: 9.5 mg/dL (ref 8.9–10.3)
CO2: 23 mmol/L (ref 22–32)
CREATININE: 0.96 mg/dL (ref 0.61–1.24)
Chloride: 107 mmol/L (ref 101–111)
Glucose, Bld: 174 mg/dL — ABNORMAL HIGH (ref 65–99)
Potassium: 3.9 mmol/L (ref 3.5–5.1)
Sodium: 141 mmol/L (ref 135–145)
TOTAL PROTEIN: 7.9 g/dL (ref 6.5–8.1)

## 2015-06-26 LAB — CBC
HCT: 49.9 % (ref 39.0–52.0)
HEMOGLOBIN: 16.8 g/dL (ref 13.0–17.0)
MCH: 30.6 pg (ref 26.0–34.0)
MCHC: 33.7 g/dL (ref 30.0–36.0)
MCV: 90.9 fL (ref 78.0–100.0)
Platelets: 163 10*3/uL (ref 150–400)
RBC: 5.49 MIL/uL (ref 4.22–5.81)
RDW: 13.3 % (ref 11.5–15.5)
WBC: 9.8 10*3/uL (ref 4.0–10.5)

## 2015-06-26 LAB — DIFFERENTIAL
Basophils Absolute: 0 10*3/uL (ref 0.0–0.1)
Basophils Relative: 0 %
EOS PCT: 0 %
Eosinophils Absolute: 0 10*3/uL (ref 0.0–0.7)
LYMPHS ABS: 1.1 10*3/uL (ref 0.7–4.0)
LYMPHS PCT: 12 %
MONO ABS: 0.4 10*3/uL (ref 0.1–1.0)
Monocytes Relative: 4 %
NEUTROS PCT: 84 %
Neutro Abs: 8.1 10*3/uL — ABNORMAL HIGH (ref 1.7–7.7)

## 2015-06-26 LAB — I-STAT CHEM 8, ED
BUN: 10 mg/dL (ref 6–20)
CALCIUM ION: 1.14 mmol/L (ref 1.13–1.30)
CREATININE: 0.8 mg/dL (ref 0.61–1.24)
Chloride: 104 mmol/L (ref 101–111)
GLUCOSE: 174 mg/dL — AB (ref 65–99)
HCT: 55 % — ABNORMAL HIGH (ref 39.0–52.0)
HEMOGLOBIN: 18.7 g/dL — AB (ref 13.0–17.0)
Potassium: 3.7 mmol/L (ref 3.5–5.1)
Sodium: 142 mmol/L (ref 135–145)
TCO2: 25 mmol/L (ref 0–100)

## 2015-06-26 LAB — PROTIME-INR
INR: 1.25 (ref 0.00–1.49)
Prothrombin Time: 15.8 seconds — ABNORMAL HIGH (ref 11.6–15.2)

## 2015-06-26 LAB — APTT: aPTT: 32 seconds (ref 24–37)

## 2015-06-26 LAB — I-STAT TROPONIN, ED: TROPONIN I, POC: 0 ng/mL (ref 0.00–0.08)

## 2015-06-26 MED ORDER — SENNOSIDES-DOCUSATE SODIUM 8.6-50 MG PO TABS: 1.0000 | ORAL_TABLET | Freq: Every evening | ORAL | Status: DC | PRN

## 2015-06-26 MED ORDER — ENOXAPARIN SODIUM 40 MG/0.4ML ~~LOC~~ SOLN
40.0000 mg | Freq: Every day | SUBCUTANEOUS | Status: DC
  Administered 2015-06-27: 40 mg via SUBCUTANEOUS
  Filled 2015-06-26: qty 0.4

## 2015-06-26 MED ORDER — OCUVITE-LUTEIN PO CAPS
1.0000 | ORAL_CAPSULE | Freq: Every day | ORAL | Status: DC
  Administered 2015-06-27: 1 via ORAL
  Filled 2015-06-26 (×2): qty 1

## 2015-06-26 MED ORDER — TAMSULOSIN HCL 0.4 MG PO CAPS
0.8000 mg | ORAL_CAPSULE | Freq: Every day | ORAL | Status: DC
  Administered 2015-06-27: 0.8 mg via ORAL
  Filled 2015-06-26: qty 2

## 2015-06-26 MED ORDER — NIACIN ER 500 MG PO CPCR
1000.0000 mg | ORAL_CAPSULE | Freq: Every day | ORAL | Status: DC
  Administered 2015-06-27: 1000 mg via ORAL
  Filled 2015-06-26 (×2): qty 2

## 2015-06-26 MED ORDER — ASPIRIN EC 81 MG PO TBEC
81.0000 mg | DELAYED_RELEASE_TABLET | Freq: Every day | ORAL | Status: DC
  Administered 2015-06-27: 81 mg via ORAL
  Filled 2015-06-26: qty 1

## 2015-06-26 MED ORDER — STROKE: EARLY STAGES OF RECOVERY BOOK
Freq: Once | Status: AC
  Administered 2015-06-27
  Filled 2015-06-26: qty 1

## 2015-06-26 MED ORDER — ADULT MULTIVITAMIN W/MINERALS CH
1.0000 | ORAL_TABLET | Freq: Every day | ORAL | Status: DC
  Administered 2015-06-27: 1 via ORAL
  Filled 2015-06-26: qty 1

## 2015-06-26 MED ORDER — AMLODIPINE BESYLATE 5 MG PO TABS: 5.0000 mg | ORAL_TABLET | Freq: Every day | ORAL | Status: DC

## 2015-06-26 NOTE — Progress Notes (Signed)
Pt arrived to room 5M11 from ED.  Pt is alert and oriented.  Safety measures in place.  Will continue to monitor.   Fredrich Romans, RN

## 2015-06-26 NOTE — H&P (Signed)
[facility] History and Physical  Billy [name] [id number] DOB: 11/10/1945 DOA: 06/26/2015  Referring physician: ED physician PCP:  Melinda Crutch, MD  Specialists:   Chief Complaint: Slurred speech, facial droop  HPI: Billy [name] is a 70 y.o. male with PMH of hypertension, hyperlipidemia, tobacco abuse, and left thalamic lacunar infarct who presents at the direction of his PCP for evaluation of a new left facial droop with slurred speech.  Patient describes pain in his usual state of fairly good health until a few days ago when he felt "not quite right," as if he were coming down with a URI. These symptoms subsided spontaneously, but today while speaking during a meeting, patient felt that he was slurring some words. He went to his PCP for evaluation, was noted to have a new left facial droop and directed to the ED. [name] has been taking a daily Plavix, managing hypertension with Norvasc, and treating dyslipidemia with niacin. He denies any headaches, dizziness, chest pain, or palpitations associated with this. He denies any recent fever, chills, nausea, or vomiting. His symptoms were described as acute onset, and now improving.  In ED, patient was found to be afebrile with vital signs stable, alert and oriented but with some slurring of words and slight left facial droop. Noncontrast head CT was obtained and negative for acute intracranial process. Initial blood work was largely unremarkable and EKG demonstrated a sinus tachycardia with LVH by voltage criteria, but no acute ischemic changes. Neurologist, Dr. Armida Sans was consulted from the emergency department and is kindly consulted on this patient. He'll be admitted for her ongoing going workup and management of suspected ischemic stroke.  Where does patient live?   At home     Can patient participate in ADLs?  Yes       Review of Systems:   General: no fevers, chills, sweats, weight change, poor appetite, or fatigue HEENT: no  blurry vision, hearing changes or sore throat Chronic sinus congestion, b/l parotid masses Pulm: no dyspnea, cough, or wheeze CV: no chest pain or palpitations Abd: no nausea, vomiting, abdominal pain, diarrhea, or constipation GU: no dysuria, hematuria, increased urinary frequency, or urgency  Ext: no leg edema Neuro: no focal weakness, numbness, or tingling, no vision change or hearing loss Skin: no rash, no wounds MSK: No muscle spasm, no deformity, no red, hot, or swollen joint Heme: No easy bruising or bleeding Travel history: No recent long distant travel    Allergy:  Allergies  Allergen Reactions  . Morphine And Related Nausea And Vomiting    Past Medical History  Diagnosis Date  . Hypertension   . Stroke ([place])   . Headache   . Tobacco abuse     History reviewed. No pertinent past surgical history.  Social History:  reports that he has been smoking Cigarettes.  He has a 5 pack-year smoking history. He does not have any smokeless tobacco history on file. He reports that he does not drink alcohol or use illicit drugs.  Family History:  Family History  Problem Relation Age of Onset  . Breast cancer Mother   . Cirrhosis Father      Prior to Admission medications   Medication Sig Start Date End Date Taking? Authorizing Provider  amLODipine (NORVASC) 5 MG tablet Take 5 mg by mouth at bedtime.   Yes Historical Provider, MD  clopidogrel (PLAVIX) 75 MG tablet Take 75 mg by mouth at bedtime.  10/07/14  Yes Historical Provider, MD  Multiple Vitamin (  MULTIVITAMIN WITH MINERALS) TABS tablet Take 1 tablet by mouth at bedtime.   Yes Historical Provider, MD  Multiple Vitamins-Minerals (PRESERVISION AREDS 2) CAPS Take 1 capsule by mouth at bedtime.   Yes Historical Provider, MD  niacin 500 MG CR capsule Take 1,000 mg by mouth at bedtime.   Yes Historical Provider, MD  tamsulosin (FLOMAX) 0.4 MG CAPS capsule Take 0.8 mg by mouth at bedtime. 09/05/14  Yes Historical Provider, MD     Physical Exam: Filed Vitals:   06/26/15 2100 06/26/15 2115 06/26/15 2130 06/26/15 2215  BP: 136/81 136/84 160/82 148/109  Pulse: 79 73 76 65  Temp:      TempSrc:      Resp: 19  20 16   SpO2: 96% 93% 94% 97%   General: Not in acute distress HEENT:       Eyes: PERRL, EOMI, no scleral icterus or conjunctival pallor.       ENT: No discharge from the ears or nose, no pharyngeal ulcers, petechiae or exudate, no tonsillar enlargement.        Neck: No JVD, no bruit, Large firm, nontender, mobile, and smooth mass at angle of right jaw Heme: No cervical adenopathy, no pallor Cardiac: S1/S2, RRR, No murmurs, No gallops or rubs. Pulm: Good air movement bilaterally. No rales, wheezing, rhonchi or rubs. Abd: Soft, nondistended, nontender, no rebound pain or gaurding, no mass or organomegaly, BS present. Ext: No LE edema bilaterally. 2+DP/PT pulse bilaterally. Musculoskeletal: No gross deformity, no red, hot, swollen joints, no limitation in ROM  Skin: No rashes or wounds on exposed surfaces  Neuro: Alert, oriented X3, cranial nerves II-XII grossly intact, muscle strength 5/5 in all extremities, slightly weaker grip and plantar flexion on left, sensation to light touch intact. Brachial reflex 2+ bilaterally. Knee reflex 2+ bilaterally. Negative Babinski's sign. Normal finger to nose test. No focal findings Psych: Patient is not overtly psychotic, mood and affect are appropriate .  Labs on Admission:  Basic Metabolic Panel:  Recent Labs Lab 06/26/15 1436 06/26/15 1447  NA 141 142  K 3.9 3.7  CL 107 104  CO2 23  --   GLUCOSE 174* 174*  BUN 9 10  CREATININE 0.96 0.80  CALCIUM 9.5  --    Liver Function Tests:  Recent Labs Lab 06/26/15 1436  AST 22  ALT 17  ALKPHOS 70  BILITOT 0.7  PROT 7.9  ALBUMIN 4.2   No results for input(s): LIPASE, AMYLASE in the last 168 hours. No results for input(s): AMMONIA in the last 168 hours. CBC:  Recent Labs Lab 06/26/15 1436 06/26/15 1447   WBC 9.8  --   NEUTROABS 8.1*  --   HGB 16.8 18.7*  HCT 49.9 55.0*  MCV 90.9  --   PLT 163  --    Cardiac Enzymes: No results for input(s): CKTOTAL, CKMB, CKMBINDEX, TROPONINI in the last 168 hours.  BNP (last 3 results) No results for input(s): BNP in the last 8760 hours.  ProBNP (last 3 results) No results for input(s): PROBNP in the last 8760 hours.  CBG: No results for input(s): GLUCAP in the last 168 hours.  Radiological Exams on Admission: Ct Head Wo Contrast  06/26/2015  CLINICAL DATA:  Unsteady. Left-sided facial droop. Slurred speech. History of stroke. EXAM: CT HEAD WITHOUT CONTRAST TECHNIQUE: Contiguous axial images were obtained from the base of the skull through the vertex without intravenous contrast. COMPARISON:  05/24/2009; 08/28/2007; brain MRI - 01/10/2015 FINDINGS: Unchanged lacunar infarct within the left thalamus (  image 15, series 2). Scattered periventricular hypodensities compatible microvascular ischemic disease. The gray-white differentiation is otherwise well maintained without CT evidence acute large territory infarct. No intraparenchymal or extra-axial mass or hemorrhage. Unchanged size and configuration of the ventricles and basilar cisterns. No midline shift. There is persistent though likely worsened near complete opacification of the bilateral anterior and posterior ethmoidal air cells with air-fluid level seen within the frontal sinus and partial opacification of the left lateral sphenoid sinus. The mastoid air cells are normally aerated. Regional soft tissues appear normal. No displaced calvarial fracture. IMPRESSION: 1. Similar findings of microvascular ischemic disease and old left thalamic lacunar infarct without acute intracranial process. 2. Progressive sinus disease as above. Correlation for symptoms of acute sinusitis is recommended. Electronically Signed   By: Sandi Mariscal M.D.   On: 06/26/2015 16:30   Mr Brain Wo Contrast (neuro Protocol)  06/26/2015   CLINICAL DATA:  70 year old hypertensive male with disequilibrium and slurred speech with left facial droop. Subsequent encounter. EXAM: MRI HEAD WITHOUT CONTRAST TECHNIQUE: Multiplanar, multiecho pulse sequences of the brain and surrounding structures were obtained without intravenous contrast. COMPARISON:  06/25/2005 CT.  01/10/2015 MR. FINDINGS: Acute nonhemorrhagic infarct extends from the mid to posterior right lenticular nucleus/right external capsule to the corona radiata. No intracranial hemorrhage. Remote left thalamic infarct. Mild chronic small vessel disease. Mild global atrophy without hydrocephalus. Enlarging bilateral parotid masses larger on the right (measuring up to 3.5 cm) suspicious for tumor. ENT consultation recommended. Major intracranial vascular structures are patent. Prominent opacification ethmoid sinus air cells. Mild to slightly moderate mucosal thickening maxillary sinuses bilaterally. Cervical medullary junction, pituitary region, pineal region and orbital structures unremarkable. IMPRESSION: Acute nonhemorrhagic infarct extends from the mid to posterior right lenticular nucleus/right external capsule to the corona radiata. Remote left thalamic infarct. Mild chronic small vessel disease. Mild global atrophy without hydrocephalus. Enlarging bilateral parotid masses larger on the right (measuring up to 3.5 cm) suspicious for tumor. ENT consultation recommended. Major intracranial vascular structures are patent. Prominent opacification ethmoid sinus air cells. Mild to slightly moderate mucosal thickening maxillary sinuses bilaterally. Electronically Signed   By: Genia Del M.D.   On: 06/26/2015 20:39    EKG: Independently reviewed.  Abnormal findings:  Sinus tachycardia and LVH by voltage criteria, neither were seen on prior  Assessment/Plan  1. Acute ischemic stroke, from right lenticular nucleus -> right external capsule  - Neurology consulting, much appreciated  - Evidence  of acute ischemia on MRI  - Symptoms are subtle and seem to be improving  - TTE, carotid duplex, lipid panel, A1c pending  - Allowing permissive HTN, controlling sugars, gently hydrating  - Q2H neuro checks overnight, diet as appropriate after swallow screen  - Was on Plavix, switching to ASA per neuro notes    2. Bilateral parotid masses  - Noted incidentally on brain CT head  - Pt already referred to ENT in [place], had workup, is being monitored   3. HTN  - Normotensive currently  - Will hold patient's home Norvasc for now, permitting HTN overnight in setting of acute ischemic stroke  - Resume home Norvasc as appropriate   4. HLD  - Pt taking niacin for this  - Statin recommended - Lipid panel ordered    5. Hyperglycemia  - CBGs QID, SSI correctional if needed - Goal is euglycemia    DVT ppx:  SQ Lovenox    Code Status: Full code Family Communication:  Yes, patient's wife at bed side Disposition Plan:  Admit to inpatient   Date of Service 06/26/2015    Vianne Bulls, MD [facility] Pager [phone]  If 7PM-7AM, please contact night-coverage [email or web address] Password [email or web address] 06/26/2015, 10:31 PM

## 2015-06-26 NOTE — ED Notes (Signed)
Report attempted-Jamie, Agricultural consultant

## 2015-06-26 NOTE — ED Provider Notes (Signed)
CSN: [id number]     Arrival date & time 06/26/15  1348 History   First MD Initiated Contact with Patient 06/26/15 1840     No chief complaint on file.   HPI   [name] is an 70 y.o. male with history of HTN and stroke who presents to the ED for evaluation of possible stroke. He states that two days ago he woke up feeling a little bit "off" like he was coming down with a cold. He states he went to play golf that morning and had to leave early because he felt like he was off balance. He states that he also noticed as he was driving home that he was drifting towards the left. He states that yesterday he then was at a meeting and was trying to read something aloud when he noticed his speech was slightly slurred. He states he went to his PCP today for eval and was sent to the ED for facial droop and possible stroke. In the ED he does have some mild slurred speech (which his wife also agrees is new from baseline) and slight word finding difficulty. His neuro exam reveals mild left facial droop at the corner of his mouth but otherwise has good strength and sensation throughout. Denies gait instability.   Past Medical History  Diagnosis Date  . Hypertension   . Stroke ([place])   . Headache   . Tobacco abuse    History reviewed. No pertinent past surgical history. Family History  Problem Relation Age of Onset  . Breast cancer Mother   . Cirrhosis Father    Social History  Substance Use Topics  . Smoking status: Current Every Day Smoker -- 0.50 packs/day for 10 years    Types: Cigarettes  . Smokeless tobacco: None  . Alcohol Use: No    Review of Systems  All other systems reviewed and are negative.     Allergies  Morphine and related  Home Medications   Prior to Admission medications   Medication Sig Start Date End Date Taking? Authorizing Provider  amLODipine (NORVASC) 5 MG tablet Take 5 mg by mouth at bedtime.   Yes Historical Provider, MD  clopidogrel (PLAVIX) 75 MG tablet Take 75 mg  by mouth at bedtime.  10/07/14  Yes Historical Provider, MD  Multiple Vitamin (MULTIVITAMIN WITH MINERALS) TABS tablet Take 1 tablet by mouth at bedtime.   Yes Historical Provider, MD  Multiple Vitamins-Minerals (PRESERVISION AREDS 2) CAPS Take 1 capsule by mouth at bedtime.   Yes Historical Provider, MD  niacin 500 MG CR capsule Take 1,000 mg by mouth at bedtime.   Yes Historical Provider, MD  tamsulosin (FLOMAX) 0.4 MG CAPS capsule Take 0.8 mg by mouth at bedtime. 09/05/14  Yes Historical Provider, MD   BP 112/76 mmHg  Pulse 87  Temp(Src) 97.7 F (36.5 C) (Oral)  Resp 16  SpO2 95% Physical Exam  Constitutional: He is oriented to person, place, and time.  HENT:  Right Ear: External ear normal.  Left Ear: External ear normal.  Nose: Nose normal.  Mouth/Throat: Oropharynx is clear and moist. No oropharyngeal exudate.  Eyes: Conjunctivae and EOM are normal. Pupils are equal, round, and reactive to light.  Neck: Normal range of motion. Neck supple.  Cardiovascular: Normal rate, regular rhythm, normal heart sounds and intact distal pulses.   Pulmonary/Chest: Effort normal and breath sounds normal. No respiratory distress. He exhibits no tenderness.  Abdominal: Soft. Bowel sounds are normal. He exhibits no distension. There is no  tenderness.  Musculoskeletal: He exhibits no edema.  Neurological: He is alert and oriented to person, place, and time. He displays a negative Romberg sign.  Mild left sided facial droop. Mild slurred speech. Cranial nerves otherwise intact. Strength and sensation intact in bilateral upper and lower extremities. Intact finger to nose. Negative pronator drift.   Skin: Skin is warm and dry.  Psychiatric: He has a normal mood and affect.  Nursing note and vitals reviewed.   ED Course  Procedures (including critical care time) Labs Review Labs Reviewed  PROTIME-INR - Abnormal; Notable for the following:    Prothrombin Time 15.8 (*)    All other components within  normal limits  DIFFERENTIAL - Abnormal; Notable for the following:    Neutro Abs 8.1 (*)    All other components within normal limits  COMPREHENSIVE METABOLIC PANEL - Abnormal; Notable for the following:    Glucose, Bld 174 (*)    All other components within normal limits  I-STAT CHEM 8, ED - Abnormal; Notable for the following:    Glucose, Bld 174 (*)    Hemoglobin 18.7 (*)    HCT 55.0 (*)    All other components within normal limits  APTT  CBC  I-STAT TROPOININ, ED  CBG MONITORING, ED    Imaging Review Ct Head Wo Contrast  06/26/2015  CLINICAL DATA:  Unsteady. Left-sided facial droop. Slurred speech. History of stroke. EXAM: CT HEAD WITHOUT CONTRAST TECHNIQUE: Contiguous axial images were obtained from the base of the skull through the vertex without intravenous contrast. COMPARISON:  05/24/2009; 08/28/2007; brain MRI - 01/10/2015 FINDINGS: Unchanged lacunar infarct within the left thalamus (image 15, series 2). Scattered periventricular hypodensities compatible microvascular ischemic disease. The gray-white differentiation is otherwise well maintained without CT evidence acute large territory infarct. No intraparenchymal or extra-axial mass or hemorrhage. Unchanged size and configuration of the ventricles and basilar cisterns. No midline shift. There is persistent though likely worsened near complete opacification of the bilateral anterior and posterior ethmoidal air cells with air-fluid level seen within the frontal sinus and partial opacification of the left lateral sphenoid sinus. The mastoid air cells are normally aerated. Regional soft tissues appear normal. No displaced calvarial fracture. IMPRESSION: 1. Similar findings of microvascular ischemic disease and old left thalamic lacunar infarct without acute intracranial process. 2. Progressive sinus disease as above. Correlation for symptoms of acute sinusitis is recommended. Electronically Signed   By: Sandi Mariscal M.D.   On: 06/26/2015  16:30   Mr Brain Wo Contrast (neuro Protocol)  06/26/2015  CLINICAL DATA:  70 year old hypertensive male with disequilibrium and slurred speech with left facial droop. Subsequent encounter. EXAM: MRI HEAD WITHOUT CONTRAST TECHNIQUE: Multiplanar, multiecho pulse sequences of the brain and surrounding structures were obtained without intravenous contrast. COMPARISON:  06/25/2005 CT.  01/10/2015 MR. FINDINGS: Acute nonhemorrhagic infarct extends from the mid to posterior right lenticular nucleus/right external capsule to the corona radiata. No intracranial hemorrhage. Remote left thalamic infarct. Mild chronic small vessel disease. Mild global atrophy without hydrocephalus. Enlarging bilateral parotid masses larger on the right (measuring up to 3.5 cm) suspicious for tumor. ENT consultation recommended. Major intracranial vascular structures are patent. Prominent opacification ethmoid sinus air cells. Mild to slightly moderate mucosal thickening maxillary sinuses bilaterally. Cervical medullary junction, pituitary region, pineal region and orbital structures unremarkable. IMPRESSION: Acute nonhemorrhagic infarct extends from the mid to posterior right lenticular nucleus/right external capsule to the corona radiata. Remote left thalamic infarct. Mild chronic small vessel disease. Mild global atrophy without hydrocephalus.  Enlarging bilateral parotid masses larger on the right (measuring up to 3.5 cm) suspicious for tumor. ENT consultation recommended. Major intracranial vascular structures are patent. Prominent opacification ethmoid sinus air cells. Mild to slightly moderate mucosal thickening maxillary sinuses bilaterally. Electronically Signed   By: Genia Del M.D.   On: 06/26/2015 20:39   I have personally reviewed and evaluated these images and lab results as part of my medical decision-making.   EKG Interpretation   Date/Time:  Thursday June 26 2015 14:34:00 EST Ventricular Rate:  117 PR Interval:   158 QRS Duration: 84 QT Interval:  322 QTC Calculation: 449 R Axis:   39 Text Interpretation:  Sinus tachycardia Possible Left atrial enlargement  Left ventricular hypertrophy Possible Inferior infarct , age undetermined  Abnormal ECG When compared with ECG of 05/24/2009, Left ventricular  hypertrophy with strain pattern is now Present Confirmed by [facility]  MD,  DAVID ([id number]) on 06/26/2015 5:46:07 PM      MDM   Final diagnoses:  Cerebrovascular accident (CVA) due to occlusion of cerebral artery ([place])    Initial CT negative. However, given facial droop MR Brain obtained which reveals acute nonhemorrhagic infarct. There are also bilateral enlarging parotid masses. Neuro consulted. Dr. Myna Hidalgo of hospitalist team to admit for stroke.     Anne Ng, PA-C 06/26/15 2133  Sharlett Iles, MD 06/27/15 [phone]

## 2015-06-26 NOTE — Consult Note (Signed)
Referring Physician: ED    Chief Complaint: imbalance, dysarthria, left face droop  HPI:                                                                                                                                         Billy [name] is an 70 y.o. male with a past medical history significant for HTN, smoking, ischemic subcortical infarcts, comes in accompanied by his sister for evaluation of the above stated symptoms. He tells me that he had had 3 strokes before but without ostensible residual deficits at this time. Stated that couple of days ago was playing golf and started noticing some imbalance, lack of coordination, " no felling right". Then, last night became aware of " slight slurring of my words" which became noticeable to family today. Further, his sister noted mild droopiness of his left face and patient went to see his primary care physician who confirmed such findings and sent patient to the ED. He denies associated HA, vertigo, double vision, difficulty swallowing, focal weakness, or vision impairment. Has been on plavix for secondary stroke prevention. Never being on aspirin. MRI brain performed today was personally reviewed and showed an acute infarct that extends from the mid to posterior right lenticular nucleus/right external capsule to the corona radiata.  Date last known well: unable to detreermine Time last known well: unable to determine tPA Given: no, out of the window   Past Medical History  Diagnosis Date  . Hypertension   . Stroke ([place])   . Headache   . Tobacco abuse     History reviewed. No pertinent past surgical history.  Family History  Problem Relation Age of Onset  . Breast cancer Mother   . Cirrhosis Father    Social History:  reports that he has been smoking Cigarettes.  He has a 5 pack-year smoking history. He does not have any smokeless tobacco history on file. He reports that he does not drink alcohol or use illicit drugs. Family history:  no MS, epilepsy, or brain tumor Allergies:  Allergies  Allergen Reactions  . Morphine And Related Nausea And Vomiting    Medications:                                                                                                                           I have reviewed the patient's current medications.  ROS:                                                                                                                                       History obtained from family, chart review and the patient  General ROS: negative for - chills, fatigue, fever, night sweats, weight gain or weight loss Psychological ROS: negative for - behavioral disorder, hallucinations, memory difficulties, mood swings or suicidal ideation Ophthalmic ROS: negative for - blurry vision, double vision, eye pain or loss of vision ENT ROS: negative for - epistaxis, nasal discharge, oral lesions, sore throat, tinnitus or vertigo Allergy and Immunology ROS: negative for - hives or itchy/watery eyes Hematological and Lymphatic ROS: negative for - bleeding problems, bruising or swollen lymph nodes Endocrine ROS: negative for - galactorrhea, hair pattern changes, polydipsia/polyuria or temperature intolerance Respiratory ROS: negative for - cough, hemoptysis, shortness of breath or wheezing Cardiovascular ROS: negative for - chest pain, dyspnea on exertion, edema or irregular heartbeat Gastrointestinal ROS: negative for - abdominal pain, diarrhea, hematemesis, nausea/vomiting or stool incontinence Genito-Urinary ROS: negative for - dysuria, hematuria, incontinence or urinary frequency/urgency Musculoskeletal ROS: negative for - joint swelling or muscular weakness Neurological ROS: as noted in HPI Dermatological ROS: negative for rash and skin lesion changes   Physical exam:  Constitutional: well developed, pleasant male in no apparent distress. Blood pressure 136/81, pulse 79, temperature 97.7 F (36.5 C), temperature  source Oral, resp. rate 19, SpO2 96 %. Eyes: no jaundice or exophthalmos.  Head: normocephalic. Neck: supple, no bruits, no JVD. Cardiac: no murmurs. Lungs: clear. Abdomen: soft, no tender, no mass. Extremities: no edema, clubbing, or cyanosis.  Skin: no rash  Neurologic Examination:                                                                                                      General: NAD Mental Status: Alert, oriented, thought content appropriate. Subtle dysarthria without evidence of aphasia.  Able to follow 3 step commands without difficulty. Cranial Nerves: II: Discs flat bilaterally; Visual fields grossly normal, pupils equal, round, reactive to light and accommodation III,IV, VI: ptosis not present, extra-ocular motions intact bilaterally V,VII: smile asymmetric due to slight left face weakness, facial light touch sensation normal bilaterally VIII: hearing normal bilaterally IX,X: uvula rises symmetrically XI: bilateral shoulder shrug XII: midline tongue extension without atrophy or fasciculations  Motor: Right : Upper extremity   5/5    Left:     Upper extremity   5/5  Lower extremity   5/5     Lower extremity   5/5 Tone and bulk:normal tone throughout; no atrophy noted Sensory: Pinprick and light touch intact throughout, bilaterally Deep Tendon Reflexes:  Right: Upper Extremity   Left: Upper extremity   biceps (C-5 to C-6) 2/4   biceps (C-5 to C-6) 2/4 tricep (C7) 2/4    triceps (C7) 2/4 Brachioradialis (C6) 2/4  Brachioradialis (C6) 2/4  Lower Extremity Lower Extremity  quadriceps (L-2 to L-4) 2/4   quadriceps (L-2 to L-4) 2/4 Achilles (S1) 2/4   Achilles (S1) 2/4  Plantars: Right: downgoing   Left: downgoing Cerebellar: normal finger-to-nose,  normal heel-to-shin test Gait:  No tested due to multiple leads    Results for orders placed or performed during the hospital encounter of 06/26/15 (from the past 48 hour(s))  Protime-INR     Status: Abnormal    Collection Time: 06/26/15  2:36 PM  Result Value Ref Range   Prothrombin Time 15.8 (H) 11.6 - 15.2 seconds   INR 1.25 0.00 - 1.49  APTT     Status: None   Collection Time: 06/26/15  2:36 PM  Result Value Ref Range   aPTT 32 24 - 37 seconds  CBC     Status: None   Collection Time: 06/26/15  2:36 PM  Result Value Ref Range   WBC 9.8 4.0 - 10.5 K/uL   RBC 5.49 4.22 - 5.81 MIL/uL   Hemoglobin 16.8 13.0 - 17.0 g/dL   HCT 49.9 39.0 - 52.0 %   MCV 90.9 78.0 - 100.0 fL   MCH 30.6 26.0 - 34.0 pg   MCHC 33.7 30.0 - 36.0 g/dL   RDW 13.3 11.5 - 15.5 %   Platelets 163 150 - 400 K/uL  Differential     Status: Abnormal   Collection Time: 06/26/15  2:36 PM  Result Value Ref Range   Neutrophils Relative % 84 %   Neutro Abs 8.1 (H) 1.7 - 7.7 K/uL   Lymphocytes Relative 12 %   Lymphs Abs 1.1 0.7 - 4.0 K/uL   Monocytes Relative 4 %   Monocytes Absolute 0.4 0.1 - 1.0 K/uL   Eosinophils Relative 0 %   Eosinophils Absolute 0.0 0.0 - 0.7 K/uL   Basophils Relative 0 %   Basophils Absolute 0.0 0.0 - 0.1 K/uL  Comprehensive metabolic panel     Status: Abnormal   Collection Time: 06/26/15  2:36 PM  Result Value Ref Range   Sodium 141 135 - 145 mmol/L   Potassium 3.9 3.5 - 5.1 mmol/L   Chloride 107 101 - 111 mmol/L   CO2 23 22 - 32 mmol/L   Glucose, Bld 174 (H) 65 - 99 mg/dL   BUN 9 6 - 20 mg/dL   Creatinine, Ser 0.96 0.61 - 1.24 mg/dL   Calcium 9.5 8.9 - 10.3 mg/dL   Total Protein 7.9 6.5 - 8.1 g/dL   Albumin 4.2 3.5 - 5.0 g/dL   AST 22 15 - 41 U/L   ALT 17 17 - 63 U/L   Alkaline Phosphatase 70 38 - 126 U/L   Total Bilirubin 0.7 0.3 - 1.2 mg/dL   GFR calc non Af Amer >60 >60 mL/min   GFR calc Af Amer >60 >60 mL/min    Comment: (NOTE) The eGFR has been calculated using the CKD EPI equation. This calculation has not been validated in all clinical situations. eGFR's persistently <60 mL/min signify possible Chronic Kidney Disease.    Anion gap 11  5 - 15  I-stat troponin, ED (not at [facility],  [facility])     Status: None   Collection Time: 06/26/15  2:45 PM  Result Value Ref Range   Troponin i, poc 0.00 0.00 - 0.08 ng/mL   Comment 3            Comment: Due to the release kinetics of cTnI, a negative result within the first hours of the onset of symptoms does not rule out myocardial infarction with certainty. If myocardial infarction is still suspected, repeat the test at appropriate intervals.   I-Stat Chem 8, ED  (not at [facility], [facility])     Status: Abnormal   Collection Time: 06/26/15  2:47 PM  Result Value Ref Range   Sodium 142 135 - 145 mmol/L   Potassium 3.7 3.5 - 5.1 mmol/L   Chloride 104 101 - 111 mmol/L   BUN 10 6 - 20 mg/dL   Creatinine, Ser 0.80 0.61 - 1.24 mg/dL   Glucose, Bld 174 (H) 65 - 99 mg/dL   Calcium, Ion 1.14 1.13 - 1.30 mmol/L   TCO2 25 0 - 100 mmol/L   Hemoglobin 18.7 (H) 13.0 - 17.0 g/dL   HCT 55.0 (H) 39.0 - 52.0 %   Ct Head Wo Contrast  06/26/2015  CLINICAL DATA:  Unsteady. Left-sided facial droop. Slurred speech. History of stroke. EXAM: CT HEAD WITHOUT CONTRAST TECHNIQUE: Contiguous axial images were obtained from the base of the skull through the vertex without intravenous contrast. COMPARISON:  05/24/2009; 08/28/2007; brain MRI - 01/10/2015 FINDINGS: Unchanged lacunar infarct within the left thalamus (image 15, series 2). Scattered periventricular hypodensities compatible microvascular ischemic disease. The gray-white differentiation is otherwise well maintained without CT evidence acute large territory infarct. No intraparenchymal or extra-axial mass or hemorrhage. Unchanged size and configuration of the ventricles and basilar cisterns. No midline shift. There is persistent though likely worsened near complete opacification of the bilateral anterior and posterior ethmoidal air cells with air-fluid level seen within the frontal sinus and partial opacification of the left lateral sphenoid sinus. The mastoid air cells are normally aerated. Regional soft tissues  appear normal. No displaced calvarial fracture. IMPRESSION: 1. Similar findings of microvascular ischemic disease and old left thalamic lacunar infarct without acute intracranial process. 2. Progressive sinus disease as above. Correlation for symptoms of acute sinusitis is recommended. Electronically Signed   By: Sandi Mariscal M.D.   On: 06/26/2015 16:30   Mr Brain Wo Contrast (neuro Protocol)  06/26/2015  CLINICAL DATA:  70 year old hypertensive male with disequilibrium and slurred speech with left facial droop. Subsequent encounter. EXAM: MRI HEAD WITHOUT CONTRAST TECHNIQUE: Multiplanar, multiecho pulse sequences of the brain and surrounding structures were obtained without intravenous contrast. COMPARISON:  06/25/2005 CT.  01/10/2015 MR. FINDINGS: Acute nonhemorrhagic infarct extends from the mid to posterior right lenticular nucleus/right external capsule to the corona radiata. No intracranial hemorrhage. Remote left thalamic infarct. Mild chronic small vessel disease. Mild global atrophy without hydrocephalus. Enlarging bilateral parotid masses larger on the right (measuring up to 3.5 cm) suspicious for tumor. ENT consultation recommended. Major intracranial vascular structures are patent. Prominent opacification ethmoid sinus air cells. Mild to slightly moderate mucosal thickening maxillary sinuses bilaterally. Cervical medullary junction, pituitary region, pineal region and orbital structures unremarkable. IMPRESSION: Acute nonhemorrhagic infarct extends from the mid to posterior right lenticular nucleus/right external capsule to the corona radiata. Remote left thalamic infarct. Mild chronic small vessel disease. Mild global atrophy without hydrocephalus. Enlarging bilateral parotid masses larger on the right (measuring  up to 3.5 cm) suspicious for tumor. ENT consultation recommended. Major intracranial vascular structures are patent. Prominent opacification ethmoid sinus air cells. Mild to slightly moderate  mucosal thickening maxillary sinuses bilaterally. Electronically Signed   By: Genia Del M.D.   On: 06/26/2015 20:39    Assessment: 70 y.o. male with acute right subcortical infarct affecting mid to posterior right lenticular nucleus/right external capsule and corona radiata. Neuro-exam significant for mild dysarthria and subtle left face weakness. Patient did not receive iv tPA due to late presentation. Admit to mediine. Complete stroke work up. Had had prior strokes on Plavix, it is unclear when he was last seen normal thus doesn't seem to be a suitable candidate for POINT-trial. Will switch to aspirin. Stroke team will follow up tomorrow.  Stroke Risk Factors - age, HTN, smoking, prior stroke x 3  Plan: 1. HgbA1c, fasting lipid panel 2. MRI, MRA  of the brain without contrast 3. Echocardiogram 4. Carotid dopplers 5. Prophylactic therapy-aspirin 6. Risk factor modification 7. Telemetry monitoring 8. Frequent neuro checks 9. PT/OT SLP 10. NPO   Dorian Pod, MD [facility] [phone]  06/26/2015, 9:33 PM

## 2015-06-26 NOTE — ED Notes (Signed)
Pt reports a few days ago felt like his equilibrium was off. This morning when he woke up felt like he had a little slurred speech. Went to pcp and sent here for left sided facial droop. Pt is ambulatory, is a x 4, some speech sounds slurred but speech is coherent. Has hx of CVA. Equal grips except for left weaker than right but is baseline after surgery. No drift, legs strong against resistance.

## 2015-06-27 ENCOUNTER — Inpatient Hospital Stay (HOSPITAL_COMMUNITY): Payer: Medicare Other

## 2015-06-27 DIAGNOSIS — I10 Essential (primary) hypertension: Secondary | ICD-10-CM

## 2015-06-27 DIAGNOSIS — R220 Localized swelling, mass and lump, head: Secondary | ICD-10-CM

## 2015-06-27 DIAGNOSIS — I639 Cerebral infarction, unspecified: Secondary | ICD-10-CM

## 2015-06-27 DIAGNOSIS — I63311 Cerebral infarction due to thrombosis of right middle cerebral artery: Secondary | ICD-10-CM | POA: Insufficient documentation

## 2015-06-27 DIAGNOSIS — E785 Hyperlipidemia, unspecified: Secondary | ICD-10-CM

## 2015-06-27 DIAGNOSIS — I6789 Other cerebrovascular disease: Secondary | ICD-10-CM

## 2015-06-27 LAB — LIPID PANEL
Cholesterol: 183 mg/dL (ref 0–200)
HDL: 30 mg/dL — ABNORMAL LOW (ref >40)
LDL CALC: 114 mg/dL — AB (ref 0–99)
TRIGLYCERIDES: 194 mg/dL — AB (ref <150)
Total CHOL/HDL Ratio: 6.1 RATIO
VLDL: 39 mg/dL (ref 0–40)

## 2015-06-27 LAB — GLUCOSE, CAPILLARY: Glucose-Capillary: 124 mg/dL — ABNORMAL HIGH (ref 65–99)

## 2015-06-27 MED ORDER — ATORVASTATIN CALCIUM 40 MG PO TABS: 40.0000 mg | ORAL_TABLET | Freq: Every day | ORAL | Status: DC

## 2015-06-27 MED ORDER — ATORVASTATIN CALCIUM 40 MG PO TABS: 40.0000 mg | ORAL_TABLET | Freq: Every day | ORAL | Status: AC

## 2015-06-27 MED ORDER — CLOPIDOGREL BISULFATE 75 MG PO TABS: 75.0000 mg | ORAL_TABLET | Freq: Every day | ORAL | Status: DC

## 2015-06-27 MED ORDER — ASPIRIN EC 325 MG PO TBEC
325.0000 mg | DELAYED_RELEASE_TABLET | Freq: Every day | ORAL | Status: DC
  Administered 2015-06-27: 325 mg via ORAL
  Filled 2015-06-27: qty 1

## 2015-06-27 MED ORDER — ASPIRIN 325 MG PO TBEC: 325.0000 mg | DELAYED_RELEASE_TABLET | Freq: Every day | ORAL | Status: DC

## 2015-06-27 NOTE — Care Management Note (Signed)
Case Management Note  Patient Details  Name: Billy [name] MRN: [id number] Date of Birth: 04-17-1946  Subjective/Objective:                    Action/Plan: Patient discharging home today self care. No needs identified by PT/OT. No further needs per CM.   Expected Discharge Date:                  Expected Discharge Plan:     In-House Referral:     Discharge planning Services     Post Acute Care Choice:    Choice offered to:     DME Arranged:    DME Agency:     HH Arranged:    HH Agency:     Status of Service:  In process, will continue to follow  Medicare Important Message Given:    Date Medicare IM Given:    Medicare IM give by:    Date Additional Medicare IM Given:    Additional Medicare Important Message give by:     If discussed at [place] of Stay Meetings, dates discussed:    Additional Comments:  Pollie Friar, RN 06/27/2015, 1:27 PM

## 2015-06-27 NOTE — Evaluation (Signed)
Occupational Therapy Evaluation and Discharge Summary Patient Details Name: Billy [name] MRN: [id number] DOB: 1945/08/13 Today's Date: 06/27/2015    History of Present Illness Pt is a 70 yo male admitted with slurred speech and L facial droop.  Pt with new R internal capsule stroke.  Pt has old L thalamic lacunar infarct.     Clinical Impression   Pt admitted with the above diagnosis and overall is close to or at baseline with all but his speech.  Speech is mildly slurred but pt does better when he talks slower. Pt walked up and down steps and was independent with basic adls.  No further OT needs at this time.  Stroke education/signs and symptoms competed.    Follow Up Recommendations  No OT follow up    Equipment Recommendations  None recommended by OT    Recommendations for Other Services       Precautions / Restrictions Precautions Precautions: None Restrictions Weight Bearing Restrictions: No      Mobility Bed Mobility Overal bed mobility: Independent                Transfers Overall transfer level: Independent Equipment used: None                  Balance Overall balance assessment: Independent                                          ADL Overall ADL's : Independent                                       General ADL Comments: Pt was independent with all basic adls     Vision Vision Assessment?: Yes Eye Alignment: Within Functional Limits Ocular Range of Motion: Within Functional Limits Alignment/Gaze Preference: Within Defined Limits Tracking/Visual Pursuits: Able to track stimulus in all quads without difficulty Saccades: Within functional limits Convergence: Within functional limits Visual Fields: No apparent deficits   Agricultural engineer Tested?: Yes   Praxis Praxis Praxis tested?: Within functional limits    Pertinent Vitals/Pain Pain Assessment: No/denies pain     Hand  Dominance Right   Extremity/Trunk Assessment Upper Extremity Assessment Upper Extremity Assessment: Overall WFL for tasks assessed   Lower Extremity Assessment Lower Extremity Assessment: Overall WFL for tasks assessed   Cervical / Trunk Assessment Cervical / Trunk Assessment: Normal   Communication Communication Communication: Other (comment) (mildly slurred speech)   Cognition Arousal/Alertness: Awake/alert Behavior During Therapy: WFL for tasks assessed/performed Overall Cognitive Status: Within Functional Limits for tasks assessed                     General Comments       Exercises       Shoulder Instructions      Home Living Family/patient expects to be discharged to:: Private residence Living Arrangements: Alone Available Help at Discharge: Available PRN/intermittently Type of Home: House Home Access: Stairs to enter [employer] of Steps: 2   Home Layout: Two level Alternate Level Stairs-Number of Steps: 16 Alternate Level Stairs-Rails: Left Bathroom Shower/Tub: Occupational psychologist: Standard     Home Equipment: None          Prior Functioning/Environment Level of Independence: Independent  OT Diagnosis:     OT Problem List:     OT Treatment/Interventions:      OT Goals(Current goals can be found in the care plan section) Acute Rehab OT Goals Patient Stated Goal: go home today OT Goal Formulation: All assessment and education complete, DC therapy  OT Frequency:     Barriers to D/C:            Co-evaluation              End of Session Nurse Communication: Mobility status  Activity Tolerance: Patient tolerated treatment well Patient left: in chair   Time: 1145-1155 OT Time Calculation (min): 10 min Charges:  OT General Charges $OT Visit: 1 Procedure OT Evaluation $OT Eval Low Complexity: 1 Procedure G-Codes:    Glenford Peers Jul 03, 2015, 11:59 AM  [phone]

## 2015-06-27 NOTE — Evaluation (Signed)
SLP Cancellation Note  Patient Details Name: Billy [name] MRN: [id number] DOB: [birth year]   Cancelled treatment:       Reason Eval/Treat Not Completed: Patient at procedure or test/unavailable   Luanna Salk, [place] [facility] SLP [phone]

## 2015-06-27 NOTE — Progress Notes (Signed)
  Echocardiogram 2D Echocardiogram has been performed.  Billy [name] 06/27/2015, 8:52 AM

## 2015-06-27 NOTE — Progress Notes (Signed)
PT Cancellation Note  Patient Details Name: Billy [name] MRN: [id number] DOB: [birth year]   Cancelled Treatment:    Reason Eval/Treat Not Completed: PT screened, no needs identified, will sign off   Megann Easterwood 06/27/2015, 1:22 PM  Pager [phone]

## 2015-06-27 NOTE — Care Management Note (Signed)
Case Management Note  Patient Details  Name: Billy [name] MRN: [id number] Date of Birth: [birth year]  Subjective/Objective:   Patient admitted with CVA. Patient from home alone.                Action/Plan: Awaiting PT/OT recommendations. CM will continue to follow for discharge needs.    Expected Discharge Date:                  Expected Discharge Plan:     In-House Referral:     Discharge planning Services     Post Acute Care Choice:    Choice offered to:     DME Arranged:    DME Agency:     HH Arranged:    HH Agency:     Status of Service:  In process, will continue to follow  Medicare Important Message Given:    Date Medicare IM Given:    Medicare IM give by:    Date Additional Medicare IM Given:    Additional Medicare Important Message give by:     If discussed at [place] of Stay Meetings, dates discussed:    Additional Comments:  Pollie Friar, RN 06/27/2015, 11:00 AM

## 2015-06-27 NOTE — Discharge Summary (Signed)
PATIENT DETAILS Name: Billy [name] Age: 70 y.o. Sex: male Date of Birth: 1946/05/15 MRN: [id number]. Admitting Physician: Vianne Bulls, MD PCP: Melinda Crutch, MD  Admit Date: 06/26/2015 Discharge date: 06/27/2015  Recommendations for Outpatient Follow-up:  1. Recheck a lipid panel and A1c in 3 months  2. Aspirin and Plavix for 3 weeks, followed by Plavix alone  3. Continue to follow-up parotid masses.  4. A1c pending at the time of discharge-please follow  PRIMARY DISCHARGE DIAGNOSIS:  Principal Problem:   Acute ischemic stroke (Grandfather) Active Problems:   Parotid mass   HTN (hypertension)   HLD (hyperlipidemia)      PAST MEDICAL HISTORY: Past Medical History  Diagnosis Date  . Hypertension   . Stroke ([place])   . Headache   . Tobacco abuse     DISCHARGE MEDICATIONS: Current Discharge Medication List    START taking these medications   Details  aspirin EC 325 MG EC tablet Take 1 tablet (325 mg total) by mouth daily. Take both aspirin and Plavix for a total of 3 weeks, and then Plavix alone Qty: 21 tablet, Refills: 0    atorvastatin (LIPITOR) 40 MG tablet Take 1 tablet (40 mg total) by mouth daily at 6 PM. Qty: 30 tablet, Refills: 0      CONTINUE these medications which have NOT CHANGED   Details  amLODipine (NORVASC) 5 MG tablet Take 5 mg by mouth at bedtime.    clopidogrel (PLAVIX) 75 MG tablet Take 75 mg by mouth at bedtime.  Refills: 1    Multiple Vitamin (MULTIVITAMIN WITH MINERALS) TABS tablet Take 1 tablet by mouth at bedtime.    Multiple Vitamins-Minerals (PRESERVISION AREDS 2) CAPS Take 1 capsule by mouth at bedtime.    tamsulosin (FLOMAX) 0.4 MG CAPS capsule Take 0.8 mg by mouth at bedtime. Refills: 1      STOP taking these medications     niacin 500 MG CR capsule         ALLERGIES:   Allergies  Allergen Reactions  . Morphine And Related Nausea And Vomiting    BRIEF HPI:  See H&P, Labs, Consult and Test reports for all details in brief,  patient was admitted for left facial droop. Further evaluation revealed stroke.  CONSULTATIONS:   neurology  PERTINENT RADIOLOGIC STUDIES: Ct Head Wo Contrast  06/26/2015  CLINICAL DATA:  Unsteady. Left-sided facial droop. Slurred speech. History of stroke. EXAM: CT HEAD WITHOUT CONTRAST TECHNIQUE: Contiguous axial images were obtained from the base of the skull through the vertex without intravenous contrast. COMPARISON:  05/24/2009; 08/28/2007; brain MRI - 01/10/2015 FINDINGS: Unchanged lacunar infarct within the left thalamus (image 15, series 2). Scattered periventricular hypodensities compatible microvascular ischemic disease. The gray-white differentiation is otherwise well maintained without CT evidence acute large territory infarct. No intraparenchymal or extra-axial mass or hemorrhage. Unchanged size and configuration of the ventricles and basilar cisterns. No midline shift. There is persistent though likely worsened near complete opacification of the bilateral anterior and posterior ethmoidal air cells with air-fluid level seen within the frontal sinus and partial opacification of the left lateral sphenoid sinus. The mastoid air cells are normally aerated. Regional soft tissues appear normal. No displaced calvarial fracture. IMPRESSION: 1. Similar findings of microvascular ischemic disease and old left thalamic lacunar infarct without acute intracranial process. 2. Progressive sinus disease as above. Correlation for symptoms of acute sinusitis is recommended. Electronically Signed   By: Sandi Mariscal M.D.   On: 06/26/2015 16:30   Mr Jodene Nam  Head Wo Contrast  06/27/2015  CLINICAL DATA:  Initial evaluation for acute stroke. EXAM: MRA HEAD WITHOUT CONTRAST TECHNIQUE: Angiographic images of the Circle of Willis were obtained using MRA technique without intravenous contrast. COMPARISON:  Prior MRI from earlier same day. FINDINGS: ANTERIOR CIRCULATION: Visualized distal cervical segments of the internal  carotid arteries are patent with antegrade flow petrous segments widely patent. Cavernous and supraclinoid segments well opacified without stenosis. A1 segments, anterior communicating artery common anterior cerebral arteries well opacified. M1 segments widely patent without stenosis or occlusion. MCA bifurcations normal. Distal MCA branches well opacified and symmetric. Distal branch small vessel changes present within the MCA branches bilaterally. POSTERIOR CIRCULATION: Vertebral arteries patent to the vertebrobasilar junction. Posterior inferior cerebral arteries patent proximally. Basilar artery widely patent. Right anterior inferior cerebral artery is dominant. Superior cerebral arteries patent bilaterally. Both posterior cerebral arteries arise from the basilar artery and are well opacified to their distal aspects. Small posterior communicating arteries noted. Mild distal small vessel type changes within the PCAs bilaterally. No aneurysm or vascular malformation. Remote left thalamic and occipital infarcts noted. Bilateral parotid mass is noted, better evaluated on prior brain MRI. Inflammatory sinus disease noted. IMPRESSION: 1. No large or proximal vessel occlusion. No high-grade or correctable stenosis. 2. Mild atheromatous small vessel irregularity within the distal MCA and PCA branches bilaterally. Normal appearance of the large and medium sized vessels. Electronically Signed   By: Jeannine Boga M.D.   On: 06/27/2015 00:13   Mr Brain Wo Contrast (neuro Protocol)  06/26/2015  CLINICAL DATA:  70 year old hypertensive male with disequilibrium and slurred speech with left facial droop. Subsequent encounter. EXAM: MRI HEAD WITHOUT CONTRAST TECHNIQUE: Multiplanar, multiecho pulse sequences of the brain and surrounding structures were obtained without intravenous contrast. COMPARISON:  06/25/2005 CT.  01/10/2015 MR. FINDINGS: Acute nonhemorrhagic infarct extends from the mid to posterior right  lenticular nucleus/right external capsule to the corona radiata. No intracranial hemorrhage. Remote left thalamic infarct. Mild chronic small vessel disease. Mild global atrophy without hydrocephalus. Enlarging bilateral parotid masses larger on the right (measuring up to 3.5 cm) suspicious for tumor. ENT consultation recommended. Major intracranial vascular structures are patent. Prominent opacification ethmoid sinus air cells. Mild to slightly moderate mucosal thickening maxillary sinuses bilaterally. Cervical medullary junction, pituitary region, pineal region and orbital structures unremarkable. IMPRESSION: Acute nonhemorrhagic infarct extends from the mid to posterior right lenticular nucleus/right external capsule to the corona radiata. Remote left thalamic infarct. Mild chronic small vessel disease. Mild global atrophy without hydrocephalus. Enlarging bilateral parotid masses larger on the right (measuring up to 3.5 cm) suspicious for tumor. ENT consultation recommended. Major intracranial vascular structures are patent. Prominent opacification ethmoid sinus air cells. Mild to slightly moderate mucosal thickening maxillary sinuses bilaterally. Electronically Signed   By: Genia Del M.D.   On: 06/26/2015 20:39     PERTINENT LAB RESULTS: CBC:  Recent Labs  06/26/15 1436 06/26/15 1447  WBC 9.8  --   HGB 16.8 18.7*  HCT 49.9 55.0*  PLT 163  --    CMET CMP     Component Value Date/Time   NA 142 06/26/2015 1447   K 3.7 06/26/2015 1447   CL 104 06/26/2015 1447   CO2 23 06/26/2015 1436   GLUCOSE 174* 06/26/2015 1447   BUN 10 06/26/2015 1447   CREATININE 0.80 06/26/2015 1447   CALCIUM 9.5 06/26/2015 1436   PROT 7.9 06/26/2015 1436   ALBUMIN 4.2 06/26/2015 1436   AST 22 06/26/2015 1436   ALT  17 06/26/2015 1436   ALKPHOS 70 06/26/2015 1436   BILITOT 0.7 06/26/2015 1436   GFRNONAA >60 06/26/2015 1436   GFRAA >60 06/26/2015 1436    GFR Estimated Creatinine Clearance: 87.4 mL/min  (by C-G formula based on Cr of 0.8). No results for input(s): LIPASE, AMYLASE in the last 72 hours. No results for input(s): CKTOTAL, CKMB, CKMBINDEX, TROPONINI in the last 72 hours. Invalid input(s): POCBNP No results for input(s): DDIMER in the last 72 hours. No results for input(s): HGBA1C in the last 72 hours.  Recent Labs  06/27/15 0021  CHOL 183  HDL 30*  LDLCALC 114*  TRIG 194*  CHOLHDL 6.1   No results for input(s): TSH, T4TOTAL, T3FREE, THYROIDAB in the last 72 hours.  Invalid input(s): FREET3 No results for input(s): VITAMINB12, FOLATE, FERRITIN, TIBC, IRON, RETICCTPCT in the last 72 hours. Coags:  Recent Labs  06/26/15 1436  INR 1.25   Microbiology: No results found for this or any previous visit (from the past 240 hour(s)).   BRIEF HOSPITAL COURSE:   Principal Problem:   Acute ischemic stroke: Resented with left facial droop and mild dysarthria, MRI brain showed a right lenticular nucleus/external capsule CVA. Thought to be secondary to small vessel disease. 2-D echo negative for embolic source, carotid Doppler does not show any significant stenosis. Seen by stroke team-recommendations are to start statin, continue both aspirin and Plavix for 3 weeks, followed by Plavix alone. A1c pending at the time of discharge, LDL 114 (goal<70).Ambulated in the hallway independently, stable for discharge. Patient instructed to follow-up with neurology and primary care M.D. on discharge.   Active Problems:   Parotid mass: Already worked up by his PCP-has seen ENT specialist in [place]-continue observation as outpatient.    HTN (hypertension): Continue amlodipine    HLD (hyperlipidemia): Continue statin on discharge.  TODAY-DAY OF DISCHARGE:  Subjective:   Iam Sender today has no headache,no chest abdominal pain,no new weakness tingling or numbness, feels much better wants to go home today.  Objective:   Blood pressure 102/58, pulse 67, temperature 97.8 F  (36.6 C), temperature source Oral, resp. rate 16, height 5\' 10"  (1.778 m), weight 87 kg (191 lb 12.8 oz), SpO2 96 %. No intake or output data in the 24 hours ending 06/27/15 1147 Filed Weights   06/27/15 0208  Weight: 87 kg (191 lb 12.8 oz)    Exam Awake Alert, Oriented *3, No new F.N deficits, Normal affect [place].AT,PERRAL Supple Neck,No JVD, No cervical lymphadenopathy appriciated.  Symmetrical Chest wall movement, Good air movement bilaterally, CTAB RRR,No Gallops,Rubs or new Murmurs, No Parasternal Heave +ve B.Sounds, Abd Soft, Non tender, No organomegaly appriciated, No rebound -guarding or rigidity. No Cyanosis, Clubbing or edema, No new Rash or bruise  DISCHARGE CONDITION: Stable  DISPOSITION: Home  DISCHARGE INSTRUCTIONS:    Activity:  As tolerated   Get Medicines reviewed and adjusted: Please take all your medications with you for your next visit with your Primary MD  Please request your Primary MD to go over all hospital tests and procedure/radiological results at the follow up, please ask your Primary MD to get all Hospital records sent to his/her office.  If you experience worsening of your admission symptoms, develop shortness of breath, life threatening emergency, suicidal or homicidal thoughts you must seek medical attention immediately by calling 911 or calling your MD immediately  if symptoms less severe.  You must read complete instructions/literature along with all the possible adverse reactions/side effects for all the Medicines you  take and that have been prescribed to you. Take any new Medicines after you have completely understood and accpet all the possible adverse reactions/side effects.   Do not drive when taking Pain medications.   Do not take more than prescribed Pain, Sleep and Anxiety Medications  Special Instructions: If you have smoked or chewed Tobacco  in the last 2 yrs please stop smoking, stop any regular Alcohol  and or any Recreational drug  use.  Wear Seat belts while driving.  Please note  You were cared for by a hospitalist during your hospital stay. Once you are discharged, your primary care physician will handle any further medical issues. Please note that NO REFILLS for any discharge medications will be authorized once you are discharged, as it is imperative that you return to your primary care physician (or establish a relationship with a primary care physician if you do not have one) for your aftercare needs so that they can reassess your need for medications and monitor your lab values.   Diet recommendation: Heart Healthy diet  Discharge Instructions    Ambulatory referral to Neurology    Complete by:  As directed   An appointment is requested in approximately: 8 weeks. Se ey by Dr. Erlinda Hong in the hospital but had previously been seen by Jaynee Eagles in the office. Need to follow up with her.     Diet - low sodium heart healthy    Complete by:  As directed      Increase activity slowly    Complete by:  As directed            Follow-up Information    Follow up with Melvenia Beam, MD In 2 months.   Specialty:  Neurology   Why:  Stroke Clinic, Office will call you with appointment date & time   Contact information:   [place] [place] [address] [phone]       Follow up with  Melinda Crutch, MD. Schedule an appointment as soon as possible for a visit in 1 week.   Specialty:  Family Medicine   Why:  Hospital follow up   Contact information:   [place] [address] [phone]      Total Time spent on discharge equal 45 minutes.  SignedOren Binet 06/27/2015 11:47 AM

## 2015-06-27 NOTE — Progress Notes (Signed)
STROKE TEAM PROGRESS NOTE   HISTORY Billy [name] is an 70 y.o. male with a past medical history significant for HTN, smoking, ischemic subcortical infarcts, comes in accompanied by his sister for evaluation of imbalance, dysarthria, left face droop. He has had 3 strokes before but without ostensible residual deficits at this time. Stated that couple of days ago was playing golf and started noticing some imbalance, lack of coordination, " no felling right". Then, last night became aware of " slight slurring of my words" which became noticeable to family today. Further, his sister noted mild droopiness of his left face and patient went to see his primary care physician who confirmed such findings and sent patient to the ED. He denies associated HA, vertigo, double vision, difficulty swallowing, focal weakness, or vision impairment. Has been on plavix for secondary stroke prevention. Never being on aspirin. MRI brain showed an acute infarct that extends from the mid to posterior right lenticular nucleus/right external capsule to the corona radiata. Last known well was unable to be determined.  Patient was not administered TPA secondary to being out of the window. He was admitted for further evaluation and treatment.   SUBJECTIVE (INTERVAL HISTORY) No family is at the bedside.  Overall he feels his condition is stable. He has 3 small vessel infarcts in the past and currently had 4th stroke. He continues to smoke and currently 1/2 PPD. Follows with Dr. Jaynee Eagles as outpt.   OBJECTIVE Temp:  [97.7 F (36.5 C)-98.3 F (36.8 C)] 97.8 F (36.6 C) (01/06 0600) Pulse Rate:  [63-130] 67 (01/06 0600) Cardiac Rhythm:  [-] Normal sinus rhythm (01/06 0706) Resp:  [13-20] 16 (01/06 0600) BP: (102-160)/(55-109) 102/58 mmHg (01/06 0600) SpO2:  [93 %-98 %] 96 % (01/06 0600) Weight:  [87 kg (191 lb 12.8 oz)] 87 kg (191 lb 12.8 oz) (01/06 0208)  CBC:   Recent Labs Lab 06/26/15 1436 06/26/15 1447  WBC 9.8  --    NEUTROABS 8.1*  --   HGB 16.8 18.7*  HCT 49.9 55.0*  MCV 90.9  --   PLT 163  --     Basic Metabolic Panel:   Recent Labs Lab 06/26/15 1436 06/26/15 1447  NA 141 142  K 3.9 3.7  CL 107 104  CO2 23  --   GLUCOSE 174* 174*  BUN 9 10  CREATININE 0.96 0.80  CALCIUM 9.5  --     Lipid Panel:     Component Value Date/Time   CHOL 183 06/27/2015 0021   TRIG 194* 06/27/2015 0021   HDL 30* 06/27/2015 0021   CHOLHDL 6.1 06/27/2015 0021   VLDL 39 06/27/2015 0021   LDLCALC 114* 06/27/2015 0021   HgbA1c:  Lab Results  Component Value Date   HGBA1C  05/25/2009    5.6 (NOTE) The ADA recommends the following therapeutic goal for glycemic control related to Hgb A1c measurement: Goal of therapy: <6.5 Hgb A1c  Reference: American Diabetes Association: Clinical Practice Recommendations 2010, Diabetes Care, 2010, 33: (Suppl  1).   Urine Drug Screen:     Component Value Date/Time   LABOPIA NONE DETECTED 08/24/2007 1012   COCAINSCRNUR NONE DETECTED 08/24/2007 1012   LABBENZ NONE DETECTED 08/24/2007 1012   AMPHETMU NONE DETECTED 08/24/2007 1012   THCU NONE DETECTED 08/24/2007 1012   LABBARB  08/24/2007 1012    NONE DETECTED        DRUG SCREEN FOR MEDICAL PURPOSES ONLY.  IF CONFIRMATION IS NEEDED FOR ANY PURPOSE, NOTIFY LAB WITHIN  5 DAYS.      IMAGING I have personally reviewed the radiological images below and agree with the radiology interpretations.  Ct Head Wo Contrast 06/26/2015   1. Similar findings of microvascular ischemic disease and old left thalamic lacunar infarct without acute intracranial process. 2. Progressive sinus disease as above. Correlation for symptoms of acute sinusitis is recommended.    Mr Billy [name] Head Wo Contrast 06/27/2015  1. No large or proximal vessel occlusion. No high-grade or correctable stenosis. 2. Mild atheromatous small vessel irregularity within the distal MCA and PCA branches bilaterally. Normal appearance of the large and medium sized vessels.    Mr Brain Wo Contrast (neuro Protocol) 06/26/2015   Acute nonhemorrhagic infarct extends from the mid to posterior right lenticular nucleus/right external capsule to the corona radiata. Remote left thalamic infarct. Mild chronic small vessel disease. Mild global atrophy without hydrocephalus. Enlarging bilateral parotid masses larger on the right (measuring up to 3.5 cm) suspicious for tumor. ENT consultation recommended. Major intracranial vascular structures are patent. Prominent opacification ethmoid sinus air cells. Mild to slightly moderate mucosal thickening maxillary sinuses bilaterally.   Carotid Doppler   There is 1-39% bilateral ICA stenosis. Vertebral artery flow is antegrade.    2D Echocardiogram  - Left ventricle: The cavity size was normal. Systolic function wasnormal. The estimated ejection fraction was in the range of 55%to 60%. Wall motion was normal; there were no regional wallmotion abnormalities. - Left atrium: The atrium was moderately dilated. - Atrial septum: No defect or patent foramen ovale was identified.   Physical exam  Temp:  [97.7 F (36.5 C)-98.3 F (36.8 C)] 97.8 F (36.6 C) (01/06 0600) Pulse Rate:  [63-81] 67 (01/06 0600) Resp:  [16-20] 16 (01/06 0600) BP: (102-160)/(55-109) 102/58 mmHg (01/06 0600) SpO2:  [93 %-97 %] 96 % (01/06 0600) Weight:  [191 lb 12.8 oz (87 kg)] 191 lb 12.8 oz (87 kg) (01/06 0208)  General - Well nourished, well developed, in no apparent distress.  Ophthalmologic - Fundi not visualized.  Cardiovascular - Regular rate and rhythm.  Mental Status -  Level of arousal and orientation to time, place, and person were intact. Language including expression, naming, repetition, comprehension was assessed and found intact. Fund of Knowledge was assessed and was intact.  Cranial Nerves II - XII - II - Visual field intact OU. III, IV, VI - Extraocular movements intact. V - Facial sensation intact bilaterally. VII - mild left  facial droop. VIII - Hearing & vestibular intact bilaterally. X - Palate elevates symmetrically. XI - Chin turning & shoulder shrug intact bilaterally. XII - Tongue protrusion intact.  Motor Strength - The patient's strength was normal in all extremities and pronator drift was absent.  Bulk was normal and fasciculations were absent.   Motor Tone - Muscle tone was assessed at the neck and appendages and was normal.  Reflexes - The patient's reflexes were 1+ in all extremities and he had no pathological reflexes.  Sensory - Light touch, temperature/pinprick were assessed and were symmetrical.    Coordination - The patient had normal movements in the hands and feet with no ataxia or dysmetria.  Tremor was absent.  Gait and Station - deferred due to safety concerns   ASSESSMENT/PLAN Billy [name] is a 70 y.o. male with history of previous stroke, HTN, HLD and smoking presenting with imbalance, dysarthria, left face droop. He did not receive IV t-PA due to unknown time of onset.   Stroke:  right  lenticular nucleus/external capsule  to corona radiata infarct secondary to small vessel disease source  MRI  R lenticular nucleus/external capsule to corona radiata infarct. Old L thalamic infarct.   MRA  Unremarkable   Carotid Doppler  No significant stenosis   2D Echo  No source of embolus   LDL 114  HgbA1c pending  Lovenox 40 mg sq daily for VTE prophylaxis Diet Heart Room service appropriate?: Yes; Fluid consistency:: Thin  clopidogrel 75 mg daily prior to admission, now on aspirin 325 mg daily. Recommend dual antiplatelets x 3 weeks, then plavix alone according to CHANCE Trial.  Patient counseled to be compliant with his antithrombotic medications  Ongoing aggressive stroke risk factor management  Therapy recommendations:  small bowel obstruction   Disposition:  Anticipate return home today  Follow up with DR. Jaynee Eagles, as he has seen her in the past.  History of  stroke  08/25/14 - midbrain stroke  08/28/14 - left thalamus  2010 - MRI negative but right sided weakness - ? DWI negative stroke  Hypertension  Stable Permissive hypertension (OK if < 220/120) but gradually normalize in 5-7 days  Hyperlipidemia  Home meds:  No statin. On niacin  LDL 114, goal < 70  Now on lipitor 40 mg daily  Continue statin at discharge  Hyperglycemia  HgbA1c pending, goal < 7.0  Follow up with PCP closely  Tobacco abuse  Current smoker  Smoking cessation counseling provided  Pt is willing to quit  Other Stroke Risk Factors  Advanced age  History of stroke  Other Active Problems  Bilateral parotid masses, bx neg. followed by ENT in [facility] day # 1  Neurology will sign off. Please call with questions. Pt will follow up with Dr. Jaynee Eagles at [facility] in about 2 months. Thanks for the consult.  Rosalin Hawking, MD PhD Stroke Neurology 06/27/2015 8:43 PM     To contact Stroke Continuity provider, please refer to [email or web address] After hours, contact General Neurology

## 2015-06-27 NOTE — Progress Notes (Signed)
*  PRELIMINARY RESULTS* Vascular Ultrasound Carotid Duplex (Doppler) has been completed.   Findings suggest 1-39% internal carotid artery stenosis bilaterally. Vertebral arteries are patent with antegrade flow.  06/27/2015 9:01 AM Maudry Mayhew, RVT, RDCS, RDMS

## 2015-06-27 NOTE — Progress Notes (Signed)
Pt discharged home. Discharge instructions reviewed. IV and telemetry discontinued. Pt left unit with family friend. Politely refused staff accompaniment to vehicle.  Left unit at 1155am. Wendee Copp

## 2015-06-28 LAB — HEMOGLOBIN A1C
HEMOGLOBIN A1C: 6.2 % — AB (ref 4.8–5.6)
MEAN PLASMA GLUCOSE: 131 mg/dL

## 2015-07-03 DIAGNOSIS — I639 Cerebral infarction, unspecified: Secondary | ICD-10-CM | POA: Diagnosis not present

## 2015-07-03 DIAGNOSIS — J329 Chronic sinusitis, unspecified: Secondary | ICD-10-CM | POA: Diagnosis not present

## 2015-07-03 DIAGNOSIS — E7800 Pure hypercholesterolemia, unspecified: Secondary | ICD-10-CM | POA: Diagnosis not present

## 2015-07-11 DIAGNOSIS — Z85828 Personal history of other malignant neoplasm of skin: Secondary | ICD-10-CM | POA: Diagnosis not present

## 2015-07-11 DIAGNOSIS — L570 Actinic keratosis: Secondary | ICD-10-CM | POA: Diagnosis not present

## 2015-08-20 DIAGNOSIS — D110 Benign neoplasm of parotid gland: Secondary | ICD-10-CM | POA: Diagnosis not present

## 2015-08-21 ENCOUNTER — Encounter: Payer: Self-pay | Admitting: Neurology

## 2015-08-21 ENCOUNTER — Ambulatory Visit (INDEPENDENT_AMBULATORY_CARE_PROVIDER_SITE_OTHER): Payer: Medicare Other | Admitting: Neurology

## 2015-08-21 VITALS — BP 124/70 | HR 75 | Ht 70.0 in | Wt 199.8 lb

## 2015-08-21 DIAGNOSIS — I639 Cerebral infarction, unspecified: Secondary | ICD-10-CM | POA: Diagnosis not present

## 2015-08-21 DIAGNOSIS — I6381 Other cerebral infarction due to occlusion or stenosis of small artery: Secondary | ICD-10-CM

## 2015-08-21 NOTE — Progress Notes (Signed)
[facility]    Provider:  Dr Jaynee Eagles Primary Care Physician:   Melinda Crutch, MD  CC:  stroke  HPI:  Billy [name] is a 70 y.o. male here as a referral from Dr. Harrington Challenger for recent stroke. He feels the symptoms have resolved for the most part. When he lays down and watches TV he feels fine. But he feels like he is in a fog all the time. He is having difficulty playing golf. His shots aren't as good. He used to love to play golf. His anger level has gone up. He gets mad very quickly. Legs feel heavy as he is is walking  Reviewed notes, labs and imaging from outside physicians, which showed: Billy [name] is an 70 y.o. male with a past medical history significant for HTN, smoking, ischemic subcortical infarcts, presented with his sister to the ED in early Jan 2017 for evaluation of imbalance, dysarthria, left face droop. He has had 3 strokes before but without ostensible residual deficits at this time. Stated that couple of days ago was playing golf and started noticing some imbalance, lack of coordination, " no felling right". Then, the night before he became aware of " slight slurring of my words" which became noticeable to family today. Further, his sister noted mild droopiness of his left face and patient went to see his primary care physician who confirmed such findings and sent patient to the ED. He denies associated HA, vertigo, double vision, difficulty swallowing, focal weakness, or vision impairment. Has been on plavix for secondary stroke prevention. MRI brain showed an acute infarct that extends from the mid to posterior right lenticular nucleus/right external capsule to the corona radiata. Last known well was unable to be determined. Patient was not administered TPA secondary to being out of the window. He was admitted for further evaluation and treatment.  IMAGING I have personally reviewed the radiological images below and agree with the radiology interpretations.  Ct  Head Wo [name] 06/26/2015 1. Similar findings of microvascular ischemic disease and old left thalamic lacunar infarct without acute intracranial process. 2. Progressive sinus disease as above. Correlation for symptoms of acute sinusitis is recommended.   Billy Billy [name] 06/27/2015 1. No large or proximal vessel occlusion. No high-grade or correctable stenosis. 2. Mild atheromatous small vessel irregularity within the distal MCA and PCA branches bilaterally. Normal appearance of the large and medium sized vessels.   Billy [name] (neuro Protocol) 06/26/2015 Acute nonhemorrhagic infarct extends from the mid to posterior right lenticular nucleus/right external capsule to the corona radiata. Remote left thalamic infarct. Mild chronic small vessel disease. Mild global atrophy without hydrocephalus. Enlarging bilateral parotid masses larger on the right (measuring up to 3.5 cm) suspicious for tumor. ENT consultation recommended. Major intracranial vascular structures are patent. Prominent opacification ethmoid sinus air cells. Mild to slightly moderate mucosal thickening maxillary sinuses bilaterally.   Carotid Doppler  There is 1-39% bilateral ICA stenosis. Vertebral artery flow is antegrade.   2D Echocardiogram  - Left ventricle: The cavity size was normal. Systolic function wasnormal. The estimated ejection fraction was in the range of 55%to 60%. Wall motion was normal; there were no regional wallmotion abnormalities. - Left atrium: The atrium was moderately dilated. - Atrial septum: No defect or patent foramen ovale was identified.  Review of Systems: Patient complains of symptoms per HPI as well as the following symptoms: no CP, no SOB. Pertinent negatives per HPI. All others negative.   Social History  Social History  . Marital Status: Widowed    Spouse Name: N/A  . Number of Children: N/A  . Years of Education: N/A   Occupational History  . retired     Social History Main Topics  . Smoking status: Current Every Day Smoker -- 0.50 packs/day for 10 years    Types: Cigarettes  . Smokeless tobacco: Not on file  . Alcohol Use: No  . Drug Use: No  . Sexual Activity: Not on file   Other Topics Concern  . Not on file   Social History Narrative   Wife is deceased.   Retired.    Family History  Problem Relation Age of Onset  . Breast cancer Mother   . Cirrhosis Father     Past Medical History  Diagnosis Date  . Hypertension   . Stroke ([place])   . Headache   . Tobacco abuse     History reviewed. No pertinent past surgical history.  Current Outpatient Prescriptions  Medication Sig Dispense Refill  . amLODipine (NORVASC) 5 MG tablet Take 5 mg by mouth at bedtime.    Marland Kitchen atorvastatin (LIPITOR) 40 MG tablet Take 1 tablet (40 mg total) by mouth daily at 6 PM. 30 tablet 0  . clopidogrel (PLAVIX) 75 MG tablet Take 75 mg by mouth at bedtime.   1  . Multiple Vitamin (MULTIVITAMIN WITH MINERALS) TABS tablet Take 1 tablet by mouth at bedtime.    . Multiple Vitamins-Minerals (PRESERVISION AREDS 2) CAPS Take 1 capsule by mouth at bedtime.    . tamsulosin (FLOMAX) 0.4 MG CAPS capsule Take 0.8 mg by mouth at bedtime.  1   No current facility-administered medications for this visit.    Allergies as of 08/21/2015 - Review Complete 06/26/2015  Allergen Reaction Noted  . Morphine and related Nausea And Vomiting 12/09/2014    Vitals: BP 124/70 mmHg  Pulse 75  Ht 5\' 10"  (1.778 m)  Wt 199 lb 12.8 oz (90.629 kg)  BMI 28.67 kg/m2 Last Weight:  Wt Readings from Last 1 Encounters:  08/21/15 199 lb 12.8 oz (90.629 kg)   Last Height:   Ht Readings from Last 1 Encounters:  08/21/15 5\' 10"  (1.778 m)    Physical exam: Exam: Gen: NAD, conversant         CV: RRR, no MRG. No Carotid Bruits. No peripheral edema, warm, nontender Eyes: Conjunctivae clear without exudates or hemorrhage  Neuro: Detailed Neurologic Exam  Speech:    Slightly  dysarthric Cognition:    The patient is oriented to person, place, and time;  Cranial Nerves:    The pupils are equal, round, and reactive to light. The fundi are normal and spontaneous venous pulsations are present. Visual fields are full to finger confrontation. Extraocular movements are intact. Trigeminal sensation is intact and the muscles of mastication are normal. Slight asymmetry of lids and very mild left NS flattening. The palate elevates in the midline. Hearing intact to voice. Voice is normal. Shoulder shrug is normal. The tongue has normal motion without fasciculations.   Coordination:    Normal finger to nose and heel to shin. Fine motor movements intact in the hands.  Gait:    No ataxia  Motor Observation:    No asymmetry, no atrophy, and no involuntary movements noted. Tone:    Normal muscle tone.    Posture:    Posture is normal. normal erect    Strength:    Strength is V/V in the upper and lower limbs.  Sensation: intact to LT       Billy [name] is a 70 y.o. male with history of previous stroke, HTN, HLD and smoking presenting with imbalance, dysarthria, left face droop. He did not receive IV t-PA due to unknown time of onset.   Stroke: right lenticular nucleus/external capsule to corona radiata infarct secondary to small vessel disease source  MRI R lenticular nucleus/external capsule to corona radiata infarct. Old L thalamic infarct.   MRA Unremarkable   Carotid Doppler No significant stenosis   2D Echo No source of embolus  LDL 114  HgbA1c pending  Lovenox 40 mg sq daily for VTE prophylaxis  Diet Heart Room service appropriate?: Yes; Fluid consistency:: Thin  clopidogrel 75 mg daily prior to admission, now on aspirin 325 mg daily. Recommend dual antiplatelets x 3 weeks, then plavix alone according to [facility] Trial.  He is now just on Plavix.  Patient counseled to be compliant with his antithrombotic medications  Ongoing  aggressive stroke risk factor management  History of stroke  08/25/14 - midbrain stroke  08/28/14 - left thalamus  2010 - MRI negative but right sided weakness - ? DWI negative stroke  Hypertension  Follow with primary care  Hyperlipidemia  LDL 114, goal < 70  lipitor 40 mg daily  Hyperglycemia  HgbA1c 6.2, goal < 7.0  Follow up with PCP closely  Tobacco abuse  Current smoker - is quitting  Smoking cessation counseling provided  Other Stroke Risk Factors  Advanced age  History of stroke  Other Active Problems  Bilateral parotid masses, bx neg. followed by ENT Dr. Minna Merritts  CC: Dr. Lovena Le, MD  [facility] [address] [place], [place] [address]  Phone [phone] Fax [phone]  A total of 25 minutes was spent face-to-face with this patient. Over half this time was spent on counseling patient on the stroke diagnosis and different diagnostic and therapeutic options available.

## 2015-08-21 NOTE — Patient Instructions (Signed)
Remember to drink plenty of fluid, eat healthy meals and do not skip any meals. Try to eat protein with a every meal and eat a healthy snack such as fruit or nuts in between meals. Try to keep a regular sleep-wake schedule and try to exercise daily, particularly in the form of walking, 20-30 minutes a day, if you can.   As far as your medications are concerned, I would like to suggest: continue current meds  As far as diagnostic testing: Non at this time  I would like to see you back in 4 months, sooner if we need to. Please call us with any interim questions, concerns, problems, updates or refill requests.   Our phone number is [phone]. We also have an after hours call service for urgent matters and there is a physician on-call for urgent questions. For any emergencies you know to call 911 or go to the nearest emergency room

## 2015-08-22 IMAGING — US US SOFT TISSUE HEAD/NECK
1 series · 14 of 25 positions shown · non-contrast
Comparison: None.

CLINICAL DATA: Evaluate right parotid mass. Palpable area for 2-3
years.

EXAM:
ULTRASOUND OF HEAD/NECK SOFT TISSUES
TECHNIQUE: Ultrasound examination of the head and neck soft tissues was
performed in the area of clinical concern.

[Series 1: us soft tissue head/neck · 0.05mm/px · 28 acquisitions, 14 frames shown]
[im 1/28]
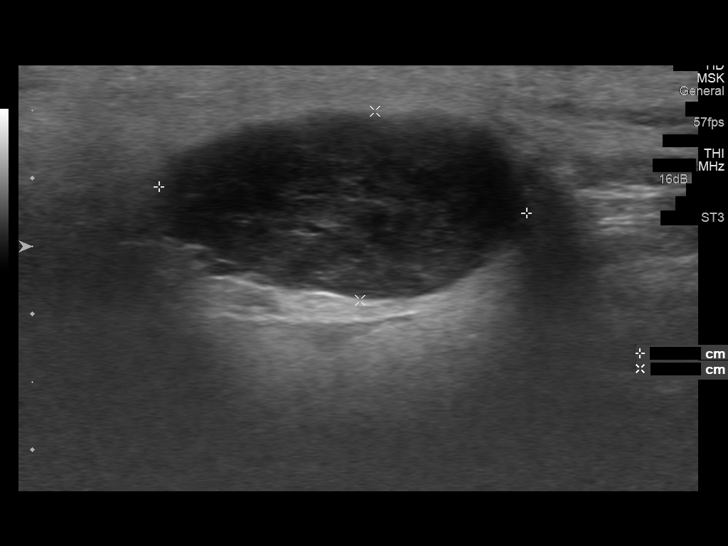
[im 3/28]
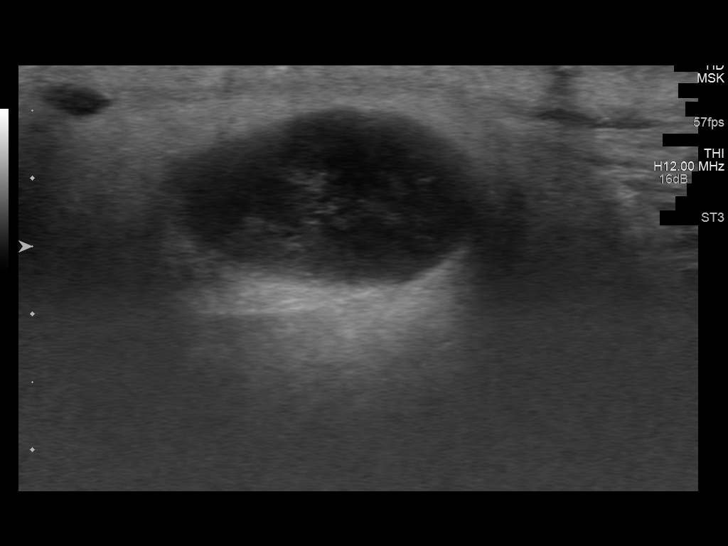
[im 5/28]
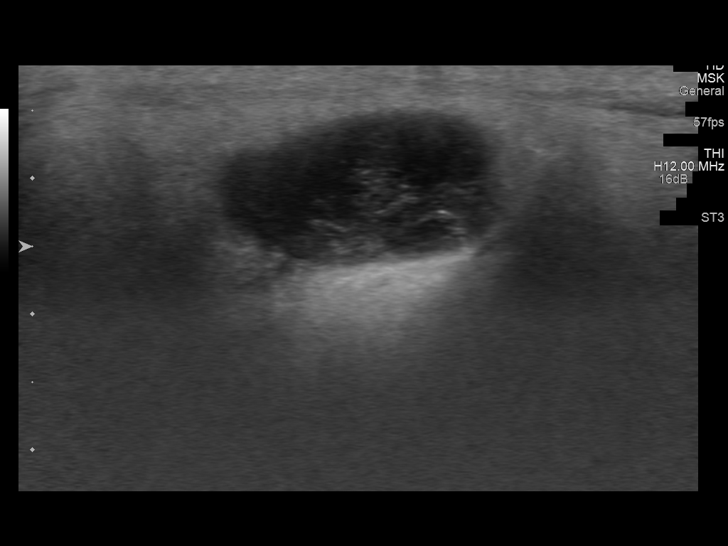
[im 7/28]
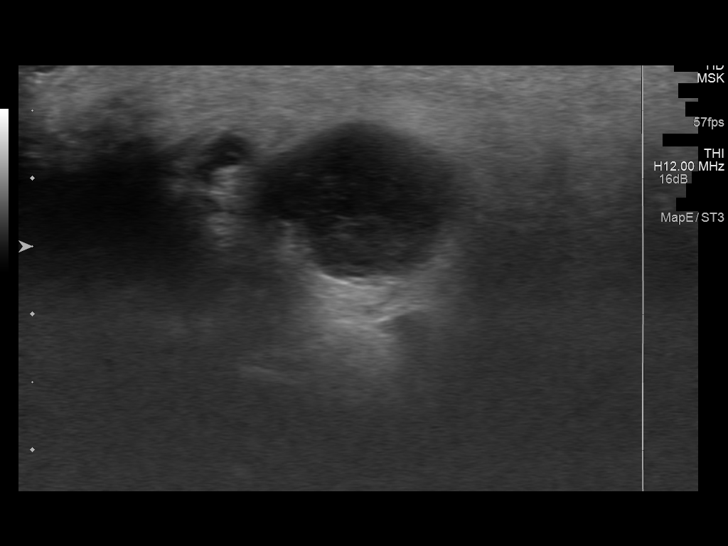
[im 10/28]
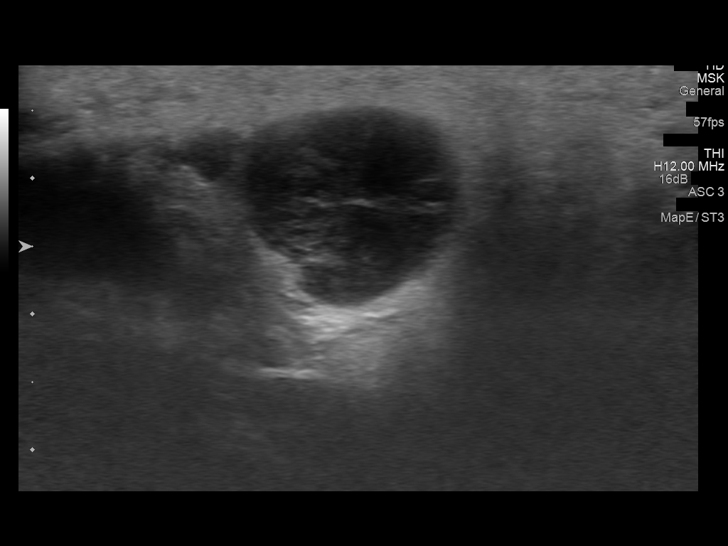
[im 11/28]
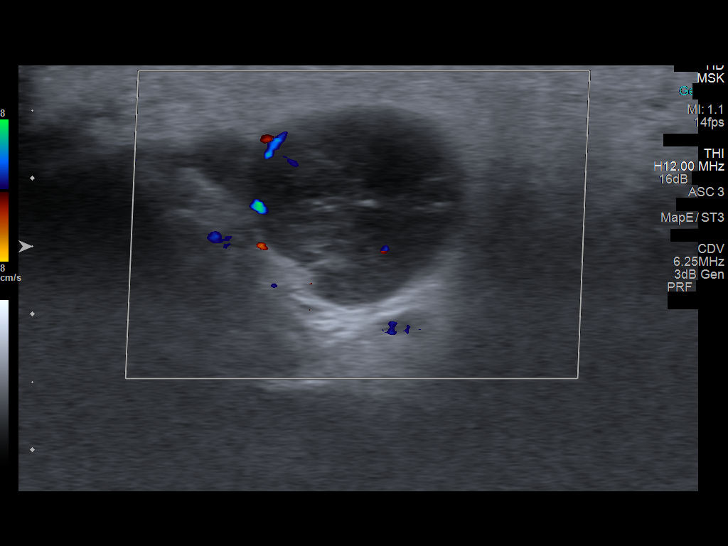
[im 13/28]
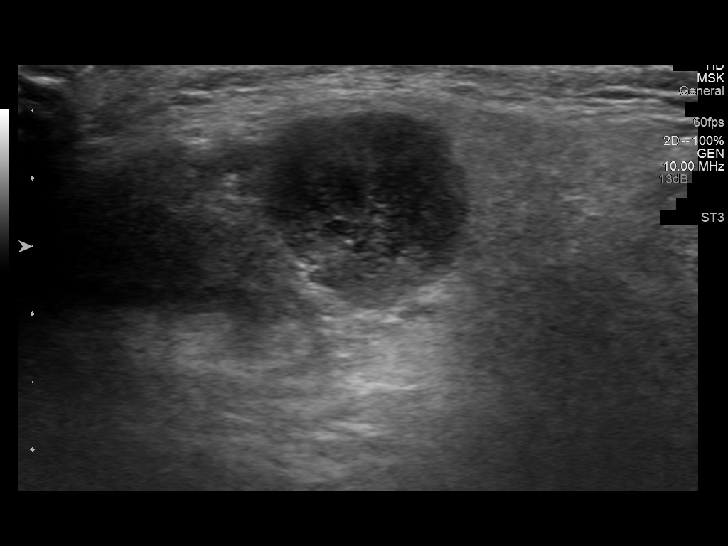
[im 15/28]
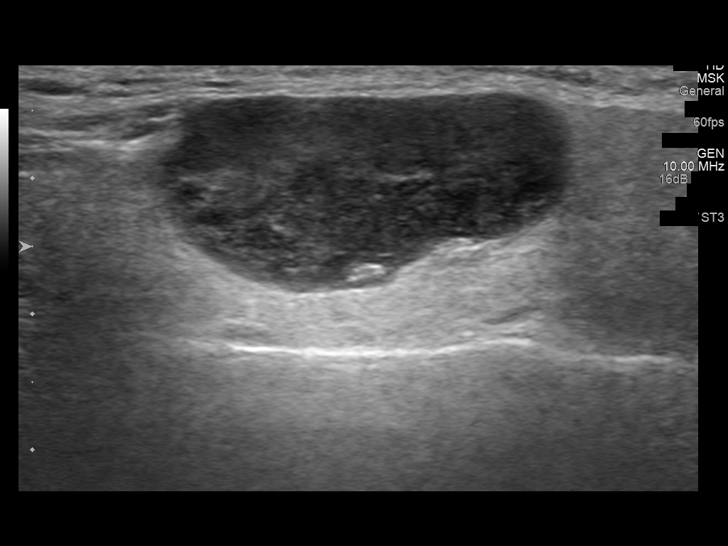
[im 17/28]
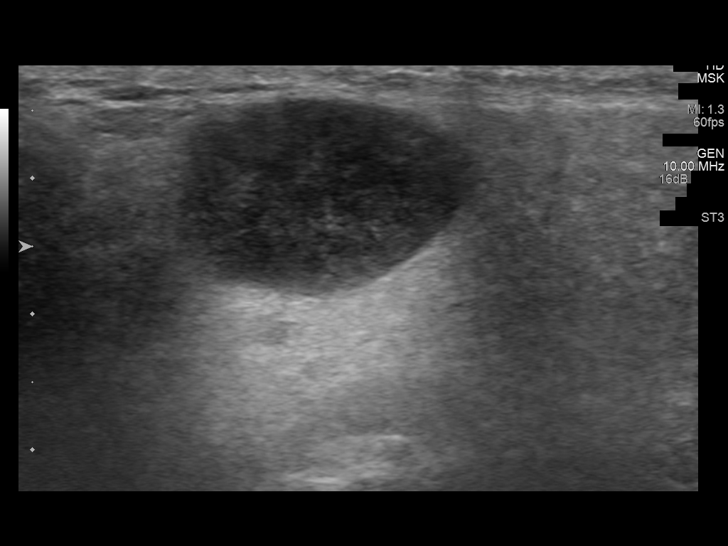
[im 19/28]
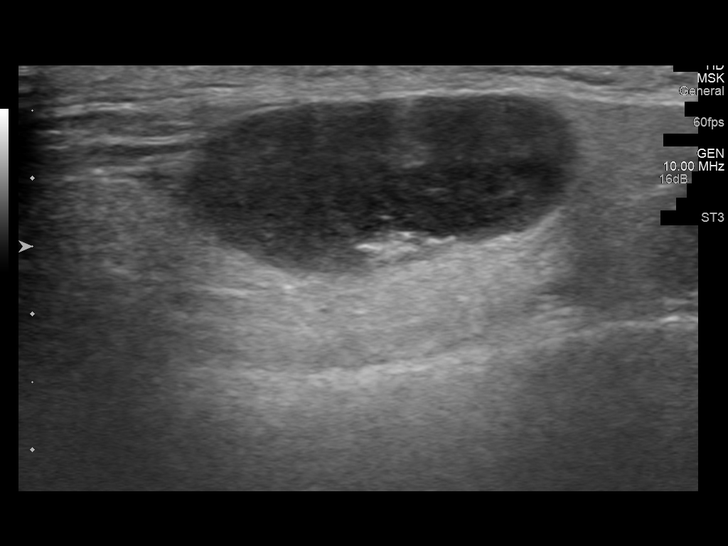
[im 21/28]
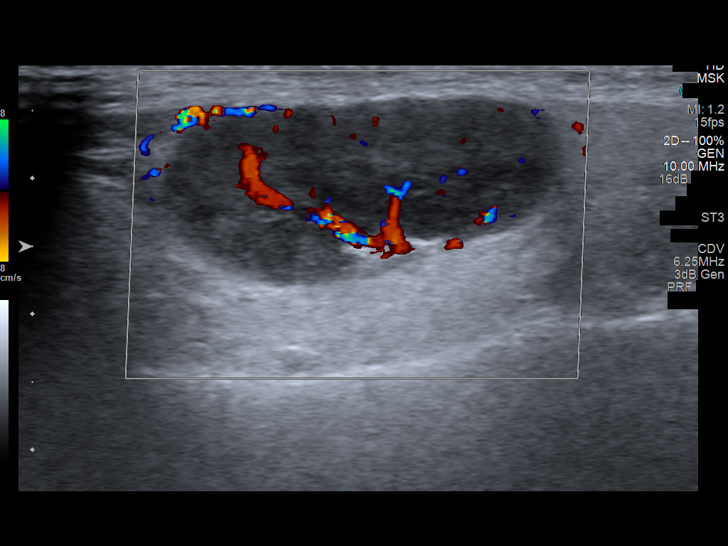
[im 23/28]
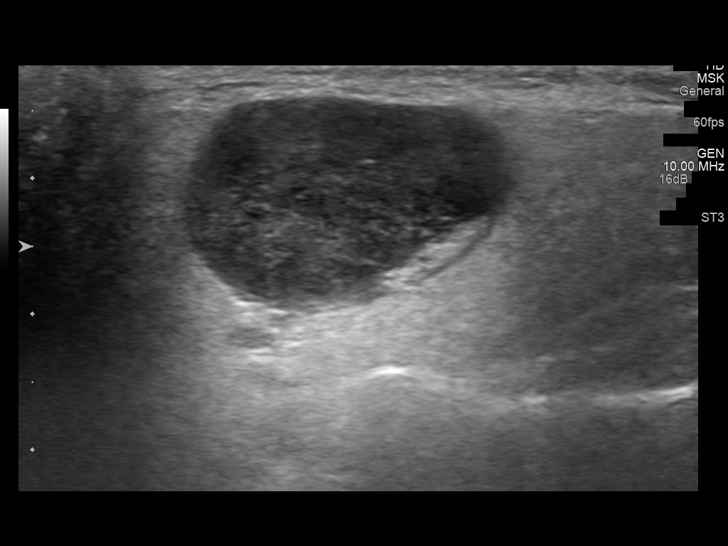
[im 25/28]
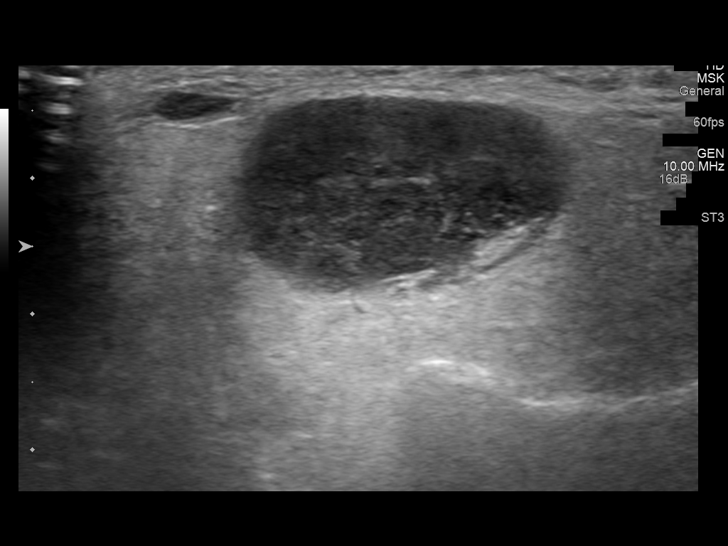
[im 28/28]
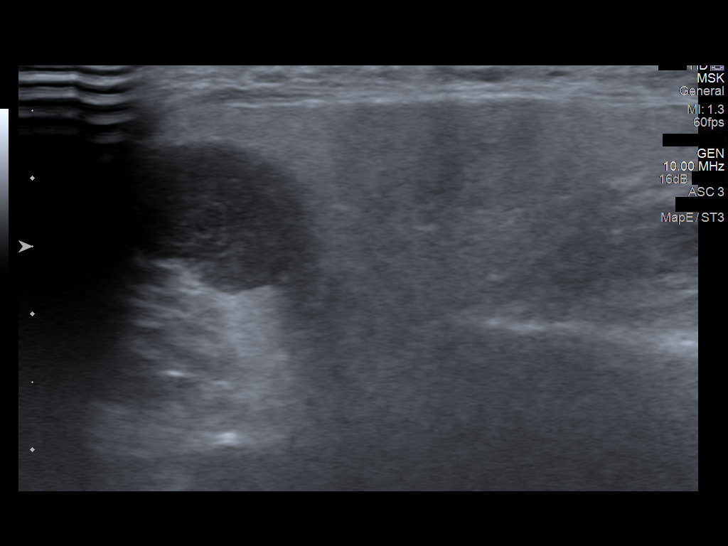

[14 of 25 positions shown; findings below may reference images not displayed]

FINDINGS: There is a well-defined hypoechoic lesion on the right neck, located
below the right ear. This lesion measures 2.7 x 1.4 x 1.6 cm. Small
amount of vascular flow within this solid lesion. Reportedly, this
lesion is not palpable. There is a second lesion which is anterior
to the right ear and this lesion is palpable. The palpable lesion
measures 3.0 x 1.4 x 2.3 cm. Based on the vascular flow pattern,
this structure may have a hilum and this could represent an enlarged
lymph node. Both of these structures are surrounded by hyperechoic
soft tissue which could represent parotid tissue. The echogenicity
of both lesions are similar.
IMPRESSION: There are two similar appearing hypoechoic lesions near the right
ear and these are probably within the right parotid tissue. These
could represent enlarged intra-parotid lymph nodes but cannot
exclude primary parotid lesions.

## 2015-09-05 IMAGING — US US BIOPSY
1 series · 13 of 17 positions shown · non-contrast
Comparison: none

CLINICAL DATA: 68-year-old male with a history of right-sided
parotid lesions.

[Series 1: us biopsy · 0.06mm/px · 13 of 17 slices shown]
[im 1/17]
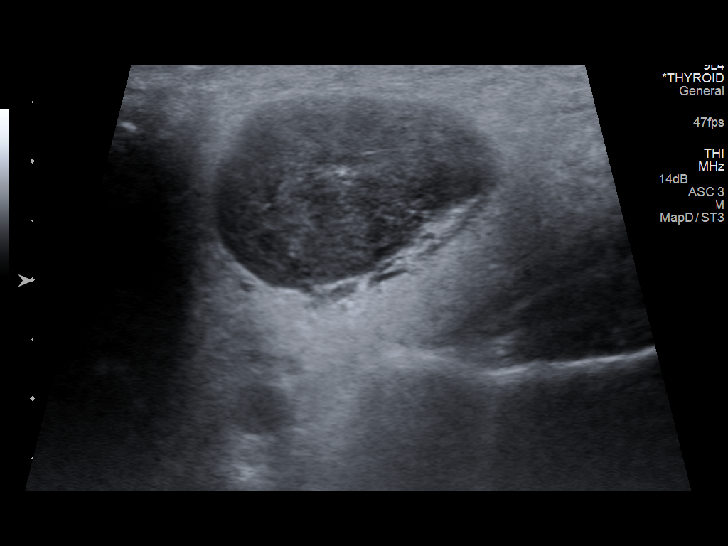
[im 2/17]
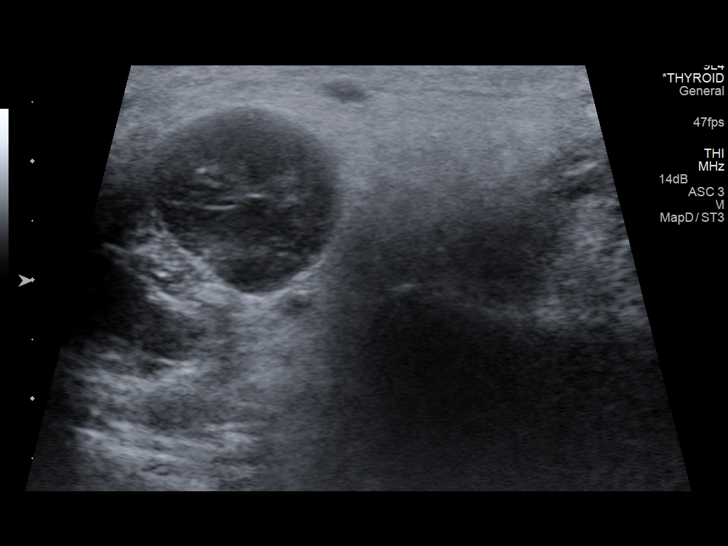
[im 4/17]
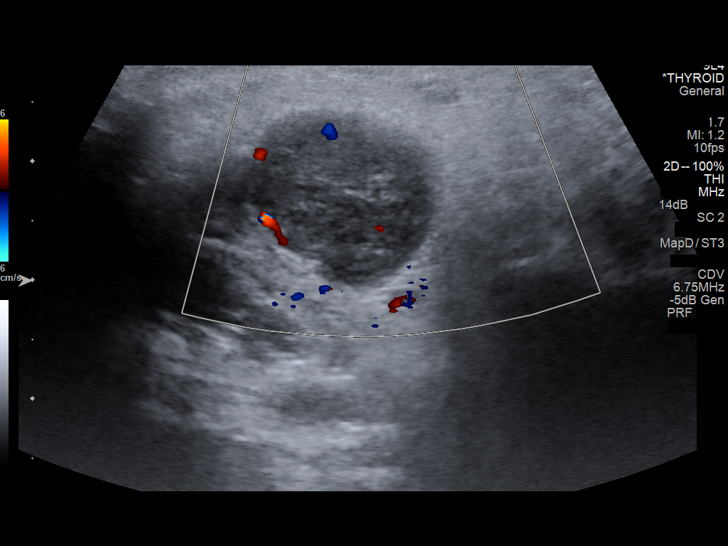
[im 5/17]
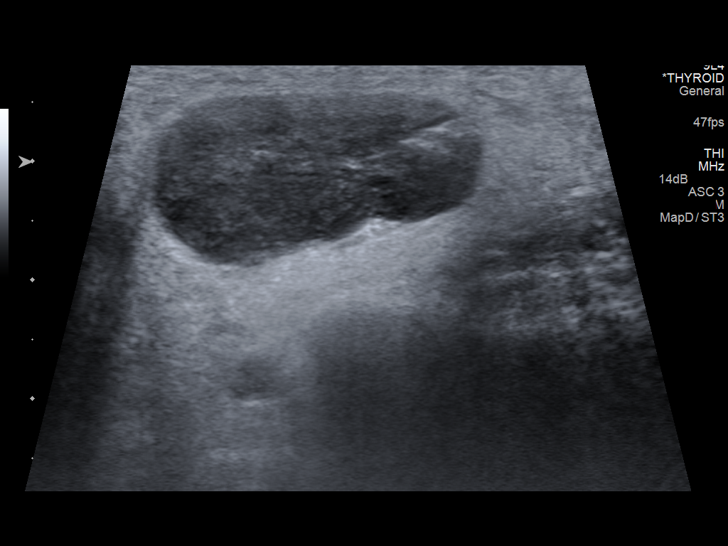
[im 6/17]
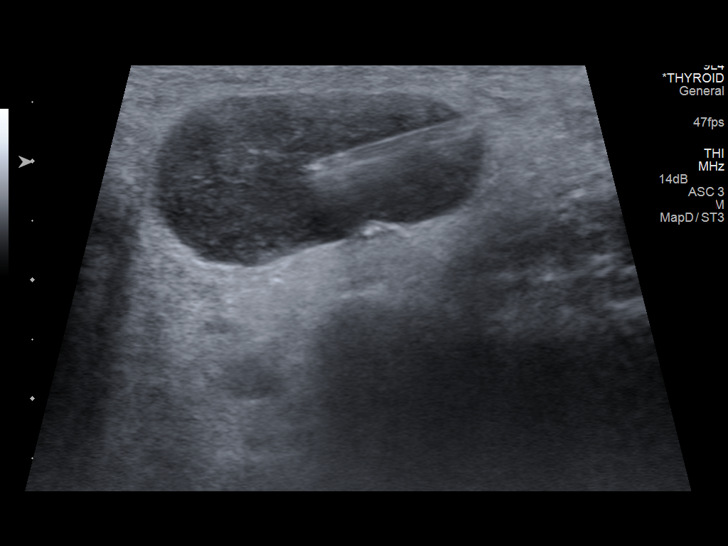
[im 8/17]
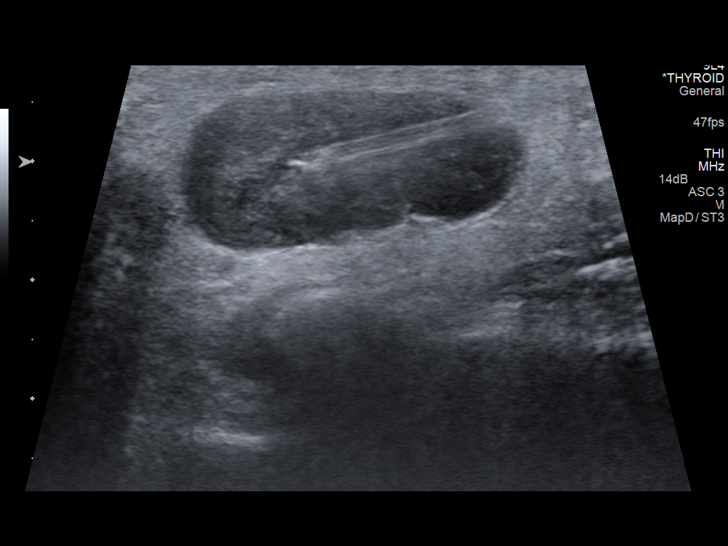
[im 9/17]
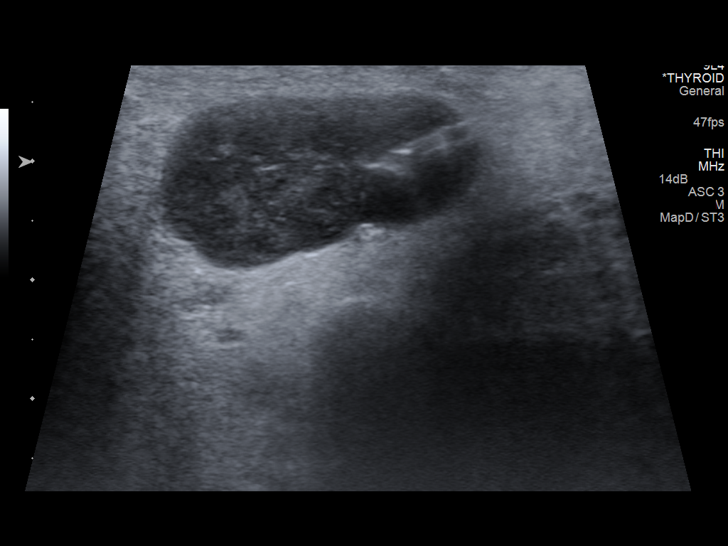
[im 10/17]
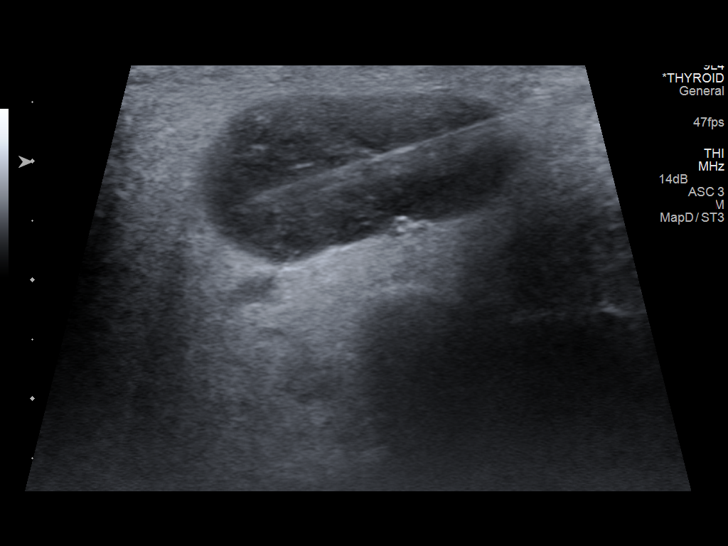
[im 12/17]
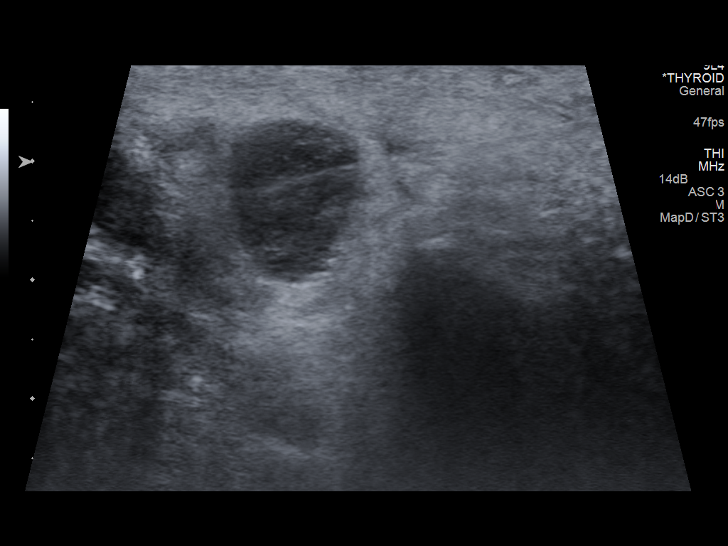
[im 13/17]
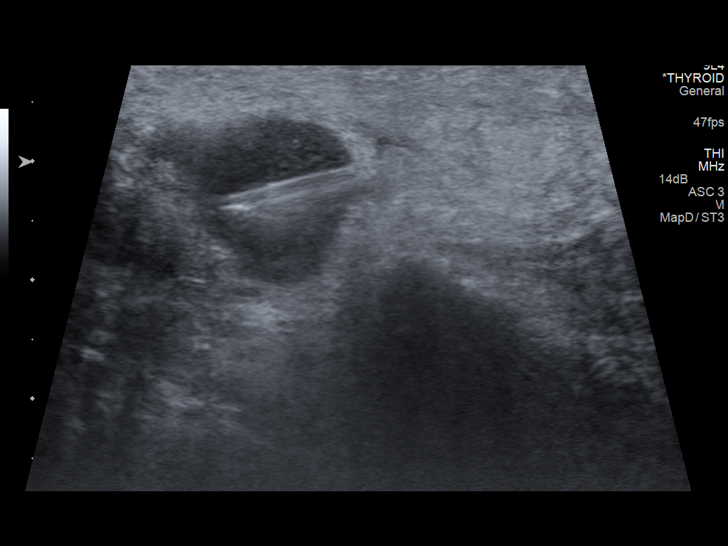
[im 14/17]
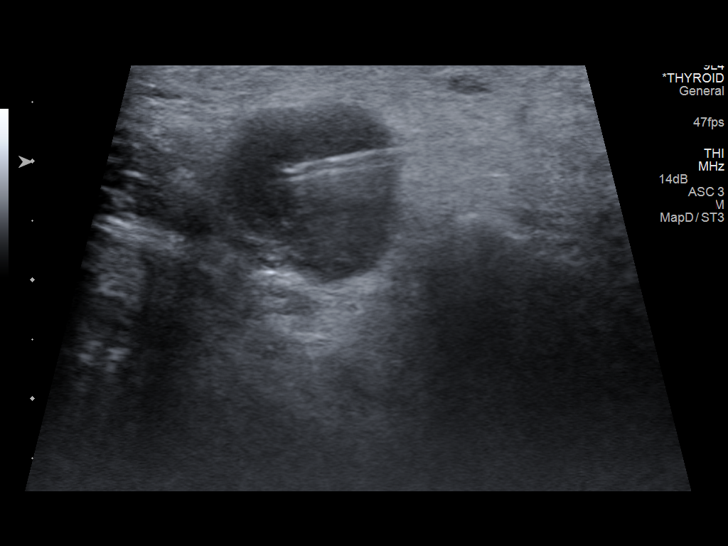
[im 16/17]
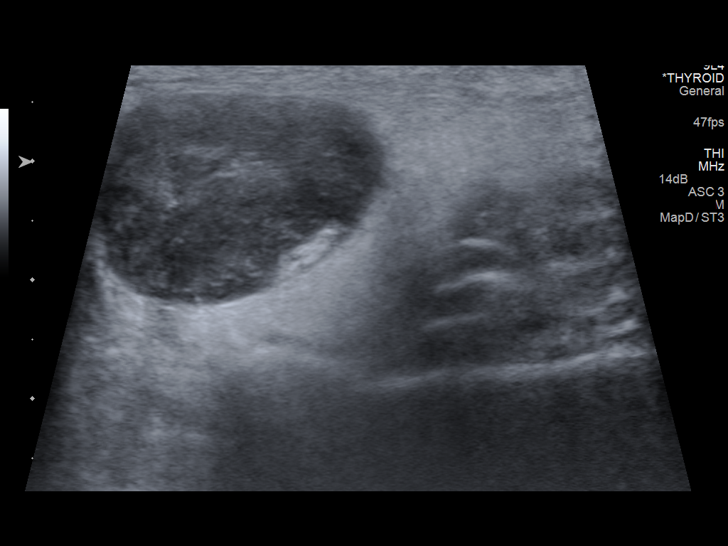
[im 17/17]
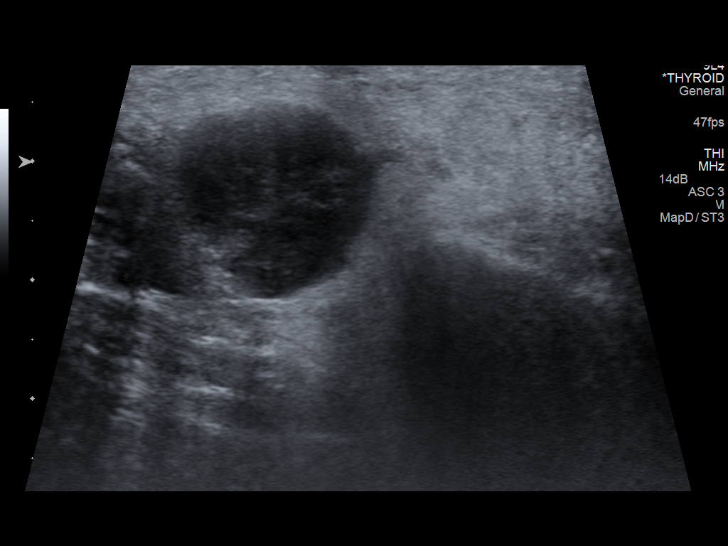

[13 of 17 positions shown; findings below may reference images not displayed]

He has 2 adjacent parotid lesions, and has been referred for
evaluation for biopsy.

EXAM:
ULTRASOUND GUIDED CORE BIOPSY OF 2 SEPARATE RIGHT PAROTID LESIONS

MEDICATIONS:
None mg IV Versed; none mcg IV Fentanyl

Total Moderate Sedation Time: None

PROCEDURE:
The procedure, risks, benefits, and alternatives were explained to
the patient. Questions regarding the procedure were encouraged and
answered. The patient understands and consents to the procedure.

Ultrasound survey was performed with images stored and sent to PACs.

The right face was prepped with Betadine in a sterile fashion, and a
sterile drape was applied covering the operative field. A sterile
gown and sterile gloves were used for the procedure. Local
anesthesia was provided with 1% Lidocaine.

Ultrasound guidance was used to infiltrate the region with 1%
lidocaine for local anesthesia. A small stab incision was made with
an 11 blade scalpel.

Superior lesion was first targeted.

Ultrasound guidance was used to advance an 18 gauge core biopsy
device into the lesion.

Three separate 18 gauge core biopsy were retrieved of the superior
lesion. Samples were placed in formalin.

We then targeted the inferior lesion. 18 gauge core biopsy device
was advanced into this lesion, with 3 separate 18 gauge biopsy
retrieved. Samples were placed into formalin.

Patient tolerated the procedure well and remained hemodynamically
stable throughout.

No complications were encountered and no significant blood loss was
encounter

COMPLICATIONS:
None.
FINDINGS: Ultrasound survey demonstrates rounded nodules within the right
parotid gland, just anterior to the ear. The superior lesion is
slightly larger than the inferior. Both are heterogeneously
hypoechoic compared to the adjacent parotid gland.

Images during the case demonstrate needle tip within the lesion on
each pass.

Final image demonstrates no complicating features.
IMPRESSION: Status post ultrasound-guided core biopsy of 2 separate right
parotid lesions, first the superior then the inferior. Tissue
specimen sent to pathology for complete histopathologic analysis.

## 2015-11-05 DIAGNOSIS — H353132 Nonexudative age-related macular degeneration, bilateral, intermediate dry stage: Secondary | ICD-10-CM | POA: Diagnosis not present

## 2015-11-05 DIAGNOSIS — H2513 Age-related nuclear cataract, bilateral: Secondary | ICD-10-CM | POA: Diagnosis not present

## 2015-11-05 DIAGNOSIS — H524 Presbyopia: Secondary | ICD-10-CM | POA: Diagnosis not present

## 2015-11-12 DIAGNOSIS — H353124 Nonexudative age-related macular degeneration, left eye, advanced atrophic with subfoveal involvement: Secondary | ICD-10-CM | POA: Diagnosis not present

## 2015-11-12 DIAGNOSIS — H353114 Nonexudative age-related macular degeneration, right eye, advanced atrophic with subfoveal involvement: Secondary | ICD-10-CM | POA: Diagnosis not present

## 2015-11-24 DIAGNOSIS — D1801 Hemangioma of skin and subcutaneous tissue: Secondary | ICD-10-CM | POA: Diagnosis not present

## 2015-11-24 DIAGNOSIS — L578 Other skin changes due to chronic exposure to nonionizing radiation: Secondary | ICD-10-CM | POA: Diagnosis not present

## 2015-11-24 DIAGNOSIS — L570 Actinic keratosis: Secondary | ICD-10-CM | POA: Diagnosis not present

## 2015-11-24 DIAGNOSIS — L820 Inflamed seborrheic keratosis: Secondary | ICD-10-CM | POA: Diagnosis not present

## 2015-11-24 DIAGNOSIS — L812 Freckles: Secondary | ICD-10-CM | POA: Diagnosis not present

## 2015-11-24 DIAGNOSIS — L821 Other seborrheic keratosis: Secondary | ICD-10-CM | POA: Diagnosis not present

## 2015-11-24 DIAGNOSIS — Z85828 Personal history of other malignant neoplasm of skin: Secondary | ICD-10-CM | POA: Diagnosis not present

## 2015-12-01 DIAGNOSIS — H2512 Age-related nuclear cataract, left eye: Secondary | ICD-10-CM | POA: Diagnosis not present

## 2015-12-15 DIAGNOSIS — H2512 Age-related nuclear cataract, left eye: Secondary | ICD-10-CM | POA: Diagnosis not present

## 2015-12-15 DIAGNOSIS — H25812 Combined forms of age-related cataract, left eye: Secondary | ICD-10-CM | POA: Diagnosis not present

## 2015-12-24 ENCOUNTER — Encounter: Payer: Self-pay | Admitting: Neurology

## 2015-12-24 ENCOUNTER — Ambulatory Visit (INDEPENDENT_AMBULATORY_CARE_PROVIDER_SITE_OTHER): Payer: Medicare Other | Admitting: Neurology

## 2015-12-24 VITALS — BP 105/71 | HR 69 | Ht 70.0 in | Wt 205.0 lb

## 2015-12-24 DIAGNOSIS — R413 Other amnesia: Secondary | ICD-10-CM

## 2015-12-24 DIAGNOSIS — E538 Deficiency of other specified B group vitamins: Secondary | ICD-10-CM | POA: Diagnosis not present

## 2015-12-24 DIAGNOSIS — I639 Cerebral infarction, unspecified: Secondary | ICD-10-CM

## 2015-12-24 DIAGNOSIS — I6381 Other cerebral infarction due to occlusion or stenosis of small artery: Secondary | ICD-10-CM

## 2015-12-24 DIAGNOSIS — H2511 Age-related nuclear cataract, right eye: Secondary | ICD-10-CM | POA: Diagnosis not present

## 2015-12-24 MED ORDER — DONEPEZIL HCL 10 MG PO TABS: 10.0000 mg | ORAL_TABLET | Freq: Every day | ORAL | Status: DC

## 2015-12-24 NOTE — Progress Notes (Signed)
[facility]   Provider: Dr Jaynee Eagles Primary Care Physician:  Melinda Crutch, MD  CC: stroke  Interval history 12/24/2015: He still feels foggy. Otherwise symptoms have resolved. No difficulty with memory, no short or long-term memory changes. He is still playing golf. He is tired during the day. Doesn't think he snores. He wakes up a lot at 3am and can't go back to sleep. He is more angry then before. Not sad. Doesn't think he snores. Not excessively tired during the day. Tried anti-depressants in the past and made him worse. Will start Aricept and see if it makes a difference as I am unclear on what he means by feeling foggy and he can;t really explain it, I wonder if he is having cognitive difficulty. Will check B12 and thyroid today.   HPI: Billy [name] is a 70 y.o. male here as a referral from Dr. Harrington Challenger for recent stroke. He feels the symptoms have resolved for the most part. When he lays down and watches TV he feels fine. But he feels like he is in a fog all the time. He is having difficulty playing golf. His shots aren't as good. He used to love to play golf. His anger level has gone up. He gets mad very quickly. Legs feel heavy as he is is walking  Reviewed notes, labs and imaging from outside physicians, which showed: Billy [name] is an 70 y.o. male with a past medical history significant for HTN, smoking, ischemic subcortical infarcts, presented with his sister to the ED in early Jan 2017 for evaluation of imbalance, dysarthria, left face droop. He has had 3 strokes before but without ostensible residual deficits at this time. Stated that couple of days ago was playing golf and started noticing some imbalance, lack of coordination, " no felling right". Then, the night before he became aware of " slight slurring of my words" which became noticeable to family today. Further, his sister noted mild droopiness of his left face and patient went to see his primary care physician  who confirmed such findings and sent patient to the ED. He denies associated HA, vertigo, double vision, difficulty swallowing, focal weakness, or vision impairment. Has been on plavix for secondary stroke prevention. MRI brain showed an acute infarct that extends from the mid to posterior right lenticular nucleus/right external capsule to the corona radiata. Last known well was unable to be determined. Patient was not administered TPA secondary to being out of the window. He was admitted for further evaluation and treatment.  IMAGING I have personally reviewed the radiological images below and agree with the radiology interpretations.  Ct Head Wo Contrast 06/26/2015 1. Similar findings of microvascular ischemic disease and old left thalamic lacunar infarct without acute intracranial process. 2. Progressive sinus disease as above. Correlation for symptoms of acute sinusitis is recommended.   Billy [name] Head Wo Contrast 06/27/2015 1. No large or proximal vessel occlusion. No high-grade or correctable stenosis. 2. Mild atheromatous small vessel irregularity within the distal MCA and PCA branches bilaterally. Normal appearance of the large and medium sized vessels.   Billy Brain Wo Contrast (neuro Protocol) 06/26/2015 Acute nonhemorrhagic infarct extends from the mid to posterior right lenticular nucleus/right external capsule to the corona radiata. Remote left thalamic infarct. Mild chronic small vessel disease. Mild global atrophy without hydrocephalus. Enlarging bilateral parotid masses larger on the right (measuring up to 3.5 cm) suspicious for tumor. ENT consultation recommended. Major intracranial vascular structures are patent. Prominent opacification ethmoid sinus air  cells. Mild to slightly moderate mucosal thickening maxillary sinuses bilaterally.   Carotid Doppler  There is 1-39% bilateral ICA stenosis. Vertebral artery flow is antegrade.   2D Echocardiogram  - Left ventricle: The cavity  size was normal. Systolic function wasnormal. The estimated ejection fraction was in the range of 55%to 60%. Wall motion was normal; there were no regional wallmotion abnormalities. - Left atrium: The atrium was moderately dilated. - Atrial septum: No defect or patent foramen ovale was identified.  Review of Systems: Patient complains of symptoms per HPI as well as the following symptoms: no CP, no SOB. Pertinent negatives per HPI. All others negative.   Social History   Social History  . Marital Status: Widowed    Spouse Name: N/A  . Number of Children: N/A  . Years of Education: N/A   Occupational History  . retired    Social History Main Topics  . Smoking status: Current Every Day Smoker -- 0.50 packs/day for 10 years    Types: Cigarettes  . Smokeless tobacco: Not on file  . Alcohol Use: No  . Drug Use: No  . Sexual Activity: Not on file   Other Topics Concern  . Not on file   Social History Narrative   Wife is deceased.   Retired.    Family History  Problem Relation Age of Onset  . Breast cancer Mother   . Cirrhosis Father     Past Medical History  Diagnosis Date  . Hypertension   . Stroke ([place])   . Headache   . Tobacco abuse     No past surgical history on file.  Current Outpatient Prescriptions  Medication Sig Dispense Refill  . amLODipine (NORVASC) 5 MG tablet Take 5 mg by mouth at bedtime.    Marland Kitchen atorvastatin (LIPITOR) 40 MG tablet Take 1 tablet (40 mg total) by mouth daily at 6 PM. 30 tablet 0  . clopidogrel (PLAVIX) 75 MG tablet Take 75 mg by mouth at bedtime.   1  . Multiple Vitamin (MULTIVITAMIN WITH MINERALS) TABS tablet Take 1 tablet by mouth at bedtime.    . Multiple Vitamins-Minerals (PRESERVISION AREDS 2) CAPS Take 1 capsule by mouth at bedtime.    . tamsulosin (FLOMAX) 0.4 MG CAPS capsule Take 0.8 mg by mouth at bedtime.  1   No current facility-administered medications for this visit.    Allergies as of 12/24/2015 - Review Complete  08/21/2015  Allergen Reaction Noted  . Morphine and related Nausea And Vomiting 12/09/2014    Vitals: There were no vitals taken for this visit. Last Weight:  Wt Readings from Last 1 Encounters:  08/21/15 199 lb 12.8 oz (90.629 kg)   Last Height:   Ht Readings from Last 1 Encounters:  08/21/15 5\' 10"  (1.778 m)       Physical exam: Exam: Gen: NAD, conversant  CV: RRR, no MRG. No Carotid Bruits. No peripheral edema, warm, nontender Eyes: Conjunctivae clear without exudates or hemorrhage  Neuro: Detailed Neurologic Exam  Speech:  Slightly dysarthric Cognition:  The patient is oriented to person, place, and time;  Cranial Nerves:  The pupils are equal, round, and reactive to light. The fundi are normal and spontaneous venous pulsations are present. Visual fields are full to finger confrontation. Extraocular movements are intact. Trigeminal sensation is intact and the muscles of mastication are normal. Slight asymmetry of lids and very mild left NS flattening. The palate elevates in the midline. Hearing intact to voice. Voice is normal. Shoulder shrug is  normal. The tongue has normal motion without fasciculations.   Coordination:  Normal finger to nose and heel to shin. Fine motor movements intact in the hands.  Gait:  No ataxia  Motor Observation:  No asymmetry, no atrophy, and no involuntary movements noted. Tone:  Normal muscle tone.   Posture:  Posture is normal. normal erect   Strength:  Strength is V/V in the upper and lower limbs.    Sensation: intact to LT     Billy. Billy [name] is a 70 y.o. male with history of previous stroke, HTN, HLD and smoking presenting with imbalance, dysarthria, left face droop. He did not receive IV t-PA due to unknown time of onset.   Patient reports continued fogginess. Unclear with a mean by fogginess, he can't really explain it, having difficulty with decisions however denies any memory  changes. We'll start Aricept, and see if this makes any sort of a difference however explained that Aricept can help protect memory and slow down memory loss. We'll also check a B12 and thyroid panel. Follow-up in 4-6 months.  Stroke: right lenticular nucleus/external capsule to corona radiata infarct secondary to small vessel disease source  MRI R lenticular nucleus/external capsule to corona radiata infarct. Old L thalamic infarct.   MRA Unremarkable   Carotid Doppler No significant stenosis   2D Echo No source of embolus  LDL 114  HgbA1c pending  History of stroke  08/25/14 - midbrain stroke  08/28/14 - left thalamus  2010 - MRI negative but right sided weakness - ? DWI negative stroke  Hypertension  Follow with primary care  Hyperlipidemia  LDL 114, goal < 70  lipitor 40 mg daily  Hyperglycemia  HgbA1c 6.2, goal < 7.0  Follow up with PCP closely  Tobacco abuse  Current smoker - is quitting  Smoking cessation counseling provided  Other Stroke Risk Factors  Advanced age  History of stroke  Other Active Problems  Bilateral parotid masses, bx neg. followed by ENT Dr. Minna Merritts  CC: Dr. Lovena Le, MD  [facility] [address] [place], [place] [address]  Phone [phone] Fax [phone]  A total of 25 minutes was spent face-to-face with this patient. Over half this time was spent on counseling patient on the stroke and cognitive changes diagnosis and different diagnostic and therapeutic options available.

## 2015-12-24 NOTE — Patient Instructions (Addendum)
Remember to drink plenty of fluid, eat healthy meals and do not skip any meals. Try to eat protein with a every meal and eat a healthy snack such as fruit or nuts in between meals. Try to keep a regular sleep-wake schedule and try to exercise daily, particularly in the form of walking, 20-30 minutes a day, if you can.   As far as your medications are concerned, I would like to suggest: Aricept 1/2 a tablet and increase to a whole tablet  As far as diagnostic testing: labs  I would like to see you back in 4-6 months, sooner if we need to. Please call us with any interim questions, concerns, problems, updates or refill requests.   Our phone number is [phone]. We also have an after hours call service for urgent matters and there is a physician on-call for urgent questions. For any emergencies you know to call 911 or go to the nearest emergency room  Donepezil tablets What is this medicine? DONEPEZIL (doe NEP e zil) is used to treat mild to moderate dementia caused by Alzheimer's disease. This medicine may be used for other purposes; ask your health care provider or pharmacist if you have questions. What should I tell my health care provider before I take this medicine? They need to know if you have any of these conditions: -asthma or other lung disease -difficulty passing urine -head injury -heart disease -history of irregular heartbeat -liver disease -seizures (convulsions) -stomach or intestinal disease, ulcers or stomach bleeding -an unusual or allergic reaction to donepezil, other medicines, foods, dyes, or preservatives -pregnant or trying to get pregnant -breast-feeding How should I use this medicine? Take this medicine by mouth with a glass of water. Follow the directions on the prescription label. You may take this medicine with or without food. Take this medicine at regular intervals. This medicine is usually taken before bedtime. Do not take it more often than directed.  Continue to take your medicine even if you feel better. Do not stop taking except on your doctor's advice. If you are taking the 23 mg donepezil tablet, swallow it whole; do not cut, crush, or chew it. Talk to your pediatrician regarding the use of this medicine in children. Special care may be needed. Overdosage: If you think you have taken too much of this medicine contact a poison control center or emergency room at once. NOTE: This medicine is only for you. Do not share this medicine with others. What if I miss a dose? If you miss a dose, take it as soon as you can. If it is almost time for your next dose, take only that dose, do not take double or extra doses. What may interact with this medicine? Do not take this medicine with any of the following medications: -certain medicines for fungal infections like itraconazole, fluconazole, posaconazole, and voriconazole -cisapride -dextromethorphan; quinidine -dofetilide -dronedarone -pimozide -quinidine -thioridazine -ziprasidone This medicine may also interact with the following medications: -antihistamines for allergy, cough and cold -atropine -bethanechol -carbamazepine -certain medicines for bladder problems like oxybutynin, tolterodine -certain medicines for Parkinson's disease like benztropine, trihexyphenidyl -certain medicines for stomach problems like dicyclomine, hyoscyamine -certain medicines for travel sickness like scopolamine -dexamethasone -ipratropium -NSAIDs, medicines for pain and inflammation, like ibuprofen or naproxen -other medicines for Alzheimer's disease -other medicines that prolong the QT interval (cause an abnormal heart rhythm) -phenobarbital -phenytoin -rifampin, rifabutin or rifapentine This list may not describe all possible interactions. Give your health care provider a list of all the  medicines, herbs, non-prescription drugs, or dietary supplements you use. Also tell them if you smoke, drink  alcohol, or use illegal drugs. Some items may interact with your medicine. What should I watch for while using this medicine? Visit your doctor or health care professional for regular checks on your progress. Check with your doctor or health care professional if your symptoms do not get better or if they get worse. You may get drowsy or dizzy. Do not drive, use machinery, or do anything that needs mental alertness until you know how this drug affects you. What side effects may I notice from receiving this medicine? Side effects that you should report to your doctor or health care professional as soon as possible: -allergic reactions like skin rash, itching or hives, swelling of the face, lips, or tongue -changes in vision -feeling faint or lightheaded, falls -problems with balance -redness, blistering, peeling or loosening of the skin, including inside the mouth -slow heartbeat, or palpitations -stomach pain -unusual bleeding or bruising, red or purple spots on the skin -vomiting -weight loss Side effects that usually do not require medical attention (report to your doctor or health care professional if they continue or are bothersome): -diarrhea, especially when starting treatment -headache -indigestion or heartburn -loss of appetite -muscle cramps -nausea This list may not describe all possible side effects. Call your doctor for medical advice about side effects. You may report side effects to FDA at 1-800-FDA-1088. Where should I keep my medicine? Keep out of reach of children. Store at room temperature between 15 and 30 degrees C (59 and 86 degrees F). Throw away any unused medicine after the expiration date. NOTE: This sheet is a summary. It may not cover all possible information. If you have questions about this medicine, talk to your doctor, pharmacist, or health care provider.    2016, [employer] Standard. (2014-01-17 07:51:52)

## 2015-12-25 ENCOUNTER — Telehealth: Payer: Self-pay | Admitting: *Deleted

## 2015-12-25 NOTE — Telephone Encounter (Signed)
-----   Message from Melvenia Beam, MD sent at 12/25/2015  5:55 PM EDT ----- Labs normal thanks

## 2015-12-25 NOTE — Telephone Encounter (Signed)
Called and spoke to pt about normal labs per Dr Ahern. Pt verbalized understanding.  

## 2015-12-26 LAB — THYROID PANEL WITH TSH
FREE THYROXINE INDEX: 1.6 (ref 1.2–4.9)
T3 UPTAKE RATIO: 24 % (ref 24–39)
T4, Total: 6.7 ug/dL (ref 4.5–12.0)
TSH: 1.89 u[IU]/mL (ref 0.450–4.500)

## 2015-12-26 LAB — METHYLMALONIC ACID, SERUM: METHYLMALONIC ACID: 130 nmol/L (ref 0–378)

## 2015-12-26 LAB — VITAMIN B12: Vitamin B-12: 551 pg/mL (ref 211–946)

## 2015-12-31 DIAGNOSIS — N400 Enlarged prostate without lower urinary tract symptoms: Secondary | ICD-10-CM | POA: Diagnosis not present

## 2015-12-31 DIAGNOSIS — I10 Essential (primary) hypertension: Secondary | ICD-10-CM | POA: Diagnosis not present

## 2015-12-31 DIAGNOSIS — J449 Chronic obstructive pulmonary disease, unspecified: Secondary | ICD-10-CM | POA: Diagnosis not present

## 2015-12-31 DIAGNOSIS — Z23 Encounter for immunization: Secondary | ICD-10-CM | POA: Diagnosis not present

## 2015-12-31 DIAGNOSIS — I679 Cerebrovascular disease, unspecified: Secondary | ICD-10-CM | POA: Diagnosis not present

## 2015-12-31 DIAGNOSIS — E7800 Pure hypercholesterolemia, unspecified: Secondary | ICD-10-CM | POA: Diagnosis not present

## 2016-01-01 DIAGNOSIS — E7800 Pure hypercholesterolemia, unspecified: Secondary | ICD-10-CM | POA: Diagnosis not present

## 2016-01-01 DIAGNOSIS — I679 Cerebrovascular disease, unspecified: Secondary | ICD-10-CM | POA: Diagnosis not present

## 2016-01-01 DIAGNOSIS — N400 Enlarged prostate without lower urinary tract symptoms: Secondary | ICD-10-CM | POA: Diagnosis not present

## 2016-01-01 DIAGNOSIS — J449 Chronic obstructive pulmonary disease, unspecified: Secondary | ICD-10-CM | POA: Diagnosis not present

## 2016-01-01 DIAGNOSIS — Z23 Encounter for immunization: Secondary | ICD-10-CM | POA: Diagnosis not present

## 2016-01-01 DIAGNOSIS — I10 Essential (primary) hypertension: Secondary | ICD-10-CM | POA: Diagnosis not present

## 2016-01-05 DIAGNOSIS — H2511 Age-related nuclear cataract, right eye: Secondary | ICD-10-CM | POA: Diagnosis not present

## 2016-01-05 DIAGNOSIS — H25811 Combined forms of age-related cataract, right eye: Secondary | ICD-10-CM | POA: Diagnosis not present

## 2016-02-17 DIAGNOSIS — Z23 Encounter for immunization: Secondary | ICD-10-CM | POA: Diagnosis not present

## 2016-02-17 DIAGNOSIS — R0602 Shortness of breath: Secondary | ICD-10-CM | POA: Diagnosis not present

## 2016-02-19 DIAGNOSIS — H26492 Other secondary cataract, left eye: Secondary | ICD-10-CM | POA: Diagnosis not present

## 2016-02-19 DIAGNOSIS — H353132 Nonexudative age-related macular degeneration, bilateral, intermediate dry stage: Secondary | ICD-10-CM | POA: Diagnosis not present

## 2016-02-19 DIAGNOSIS — H524 Presbyopia: Secondary | ICD-10-CM | POA: Diagnosis not present

## 2016-05-31 DIAGNOSIS — L821 Other seborrheic keratosis: Secondary | ICD-10-CM | POA: Diagnosis not present

## 2016-05-31 DIAGNOSIS — Z85828 Personal history of other malignant neoplasm of skin: Secondary | ICD-10-CM | POA: Diagnosis not present

## 2016-05-31 DIAGNOSIS — L570 Actinic keratosis: Secondary | ICD-10-CM | POA: Diagnosis not present

## 2016-05-31 DIAGNOSIS — D0472 Carcinoma in situ of skin of left lower limb, including hip: Secondary | ICD-10-CM | POA: Diagnosis not present

## 2016-05-31 DIAGNOSIS — D2371 Other benign neoplasm of skin of right lower limb, including hip: Secondary | ICD-10-CM | POA: Diagnosis not present

## 2016-05-31 DIAGNOSIS — D485 Neoplasm of uncertain behavior of skin: Secondary | ICD-10-CM | POA: Diagnosis not present

## 2016-05-31 DIAGNOSIS — L853 Xerosis cutis: Secondary | ICD-10-CM | POA: Diagnosis not present

## 2016-06-15 IMAGING — MR MR HEAD W/O CM
8 of 10 series · 35 of 48 positions shown · non-contrast
Comparison: 06/25/2005 CT.  01/10/2015 MR.

CLINICAL DATA: 69-year-old hypertensive male with disequilibrium
and slurred speech with left facial droop. Subsequent encounter.

EXAM:
MRI HEAD WITHOUT CONTRAST
TECHNIQUE: Multiplanar, multiecho pulse sequences of the brain and surrounding
structures were obtained without intravenous contrast.

[Series 3: DWI · axial · 3.0mm · 1.09mm/px · z∈[-13,+125]mm · 9 of 94 slices shown (1 of 4)]
[im 1/94]
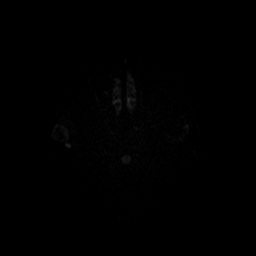
[im 12/94]
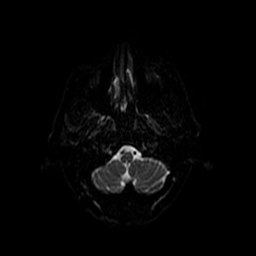
[im 24/94]
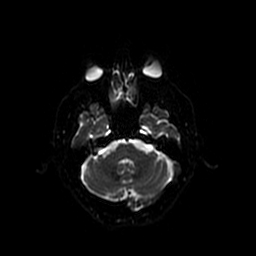
[im 35/94]
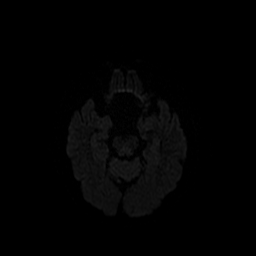
[im 47/94]
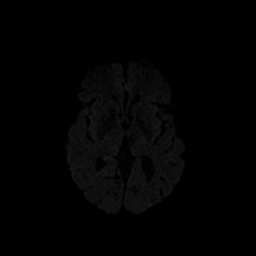
[im 59/94]
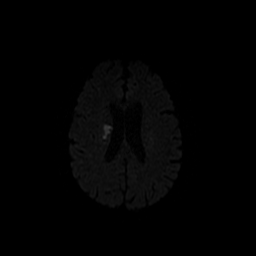
[im 70/94]
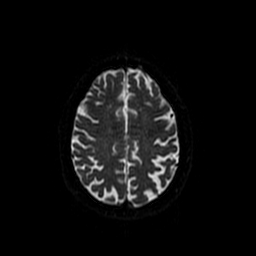
[im 82/94]
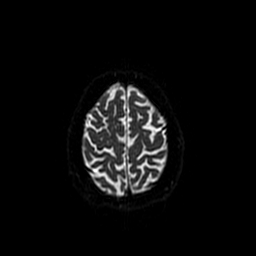
[im 94/94]
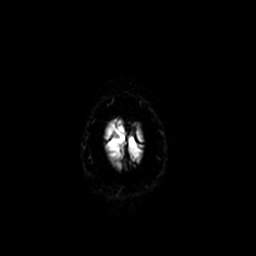

[Series 4: DWI · coronal · 5.0mm · 1.09mm/px · 7 of 66 slices shown (2 of 4)]
[im 1/66]
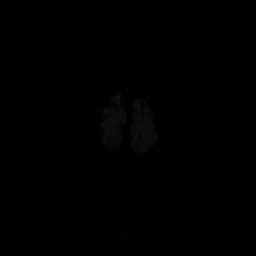
[im 11/66]
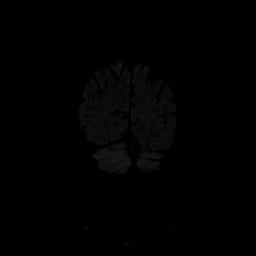
[im 22/66]
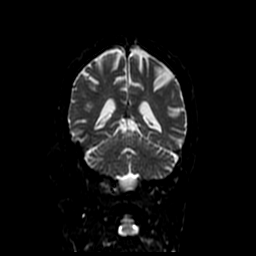
[im 33/66]
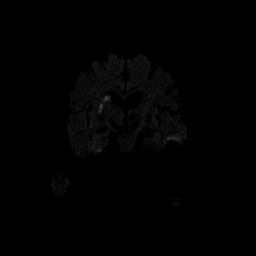
[im 44/66]
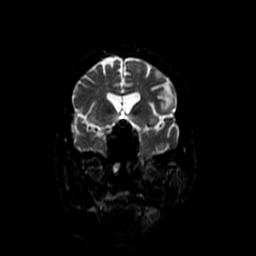
[im 55/66]
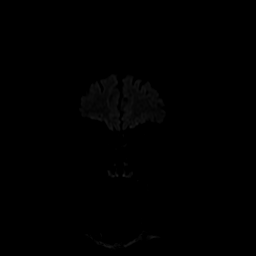
[im 66/66]
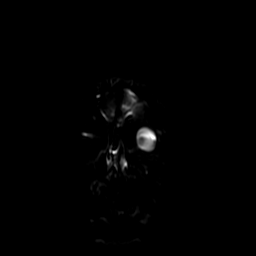

[Series 5: T1 · sagittal · 5.0mm · 0.47mm/px · 2 of 23 slices shown]
[im 1/23]
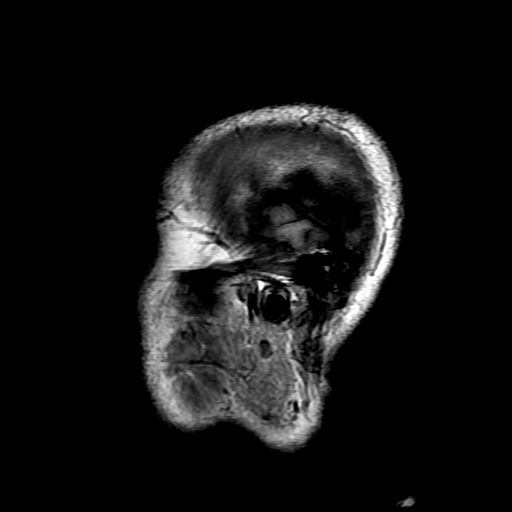
[im 23/23]
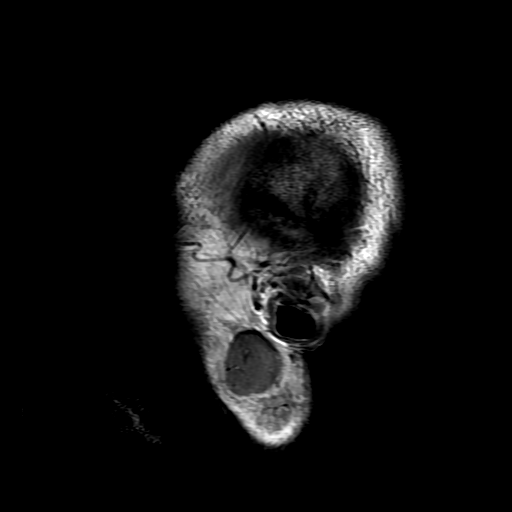

[Series 6: T2 · axial · 5.0mm · 0.47mm/px · z∈[-21,+123]mm · 3 of 25 slices shown (1 of 2)]
[im 1/25]
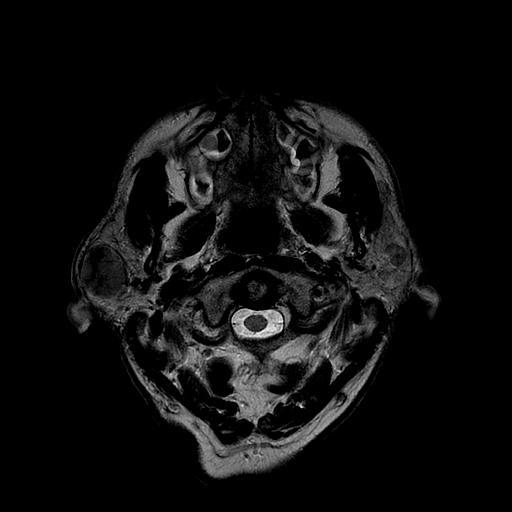
[im 13/25]
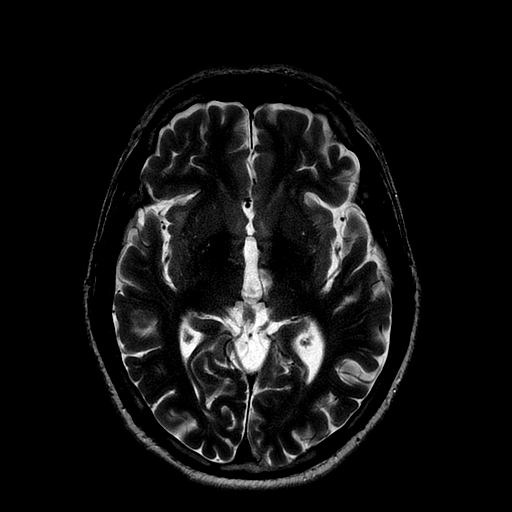
[im 25/25]
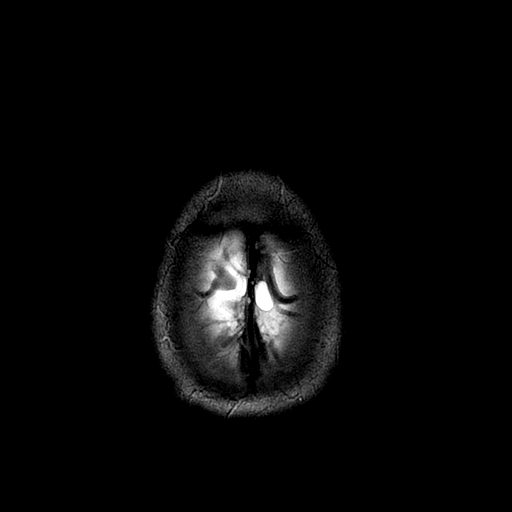

[Series 7: FLAIR · axial · 5.0mm · 0.47mm/px · z∈[-21,+123]mm · 3 of 25 slices shown]
[im 1/25]
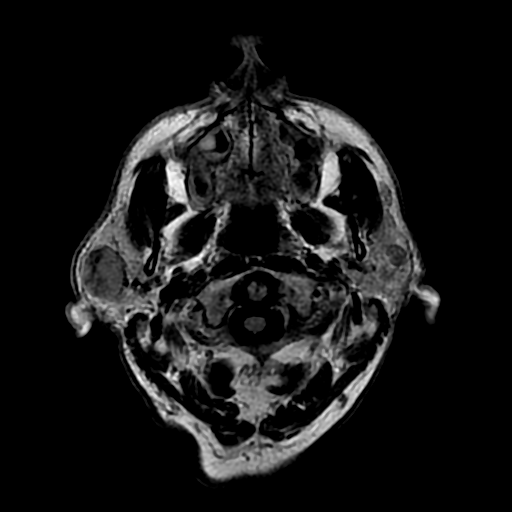
[im 13/25]
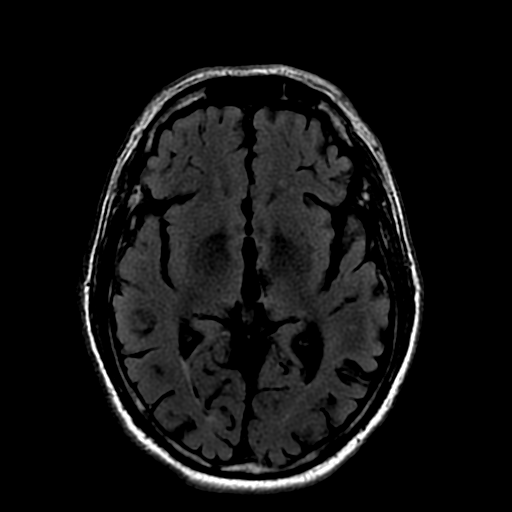
[im 25/25]
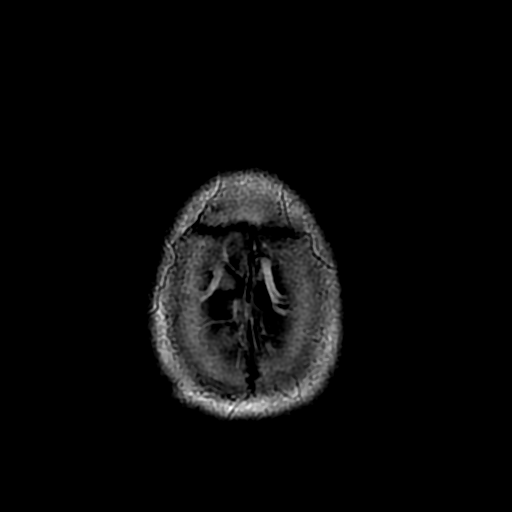

[Series 10: T2 · coronal · 5.0mm · 0.43mm/px · 3 of 28 slices shown (2 of 2)]
[im 1/28]
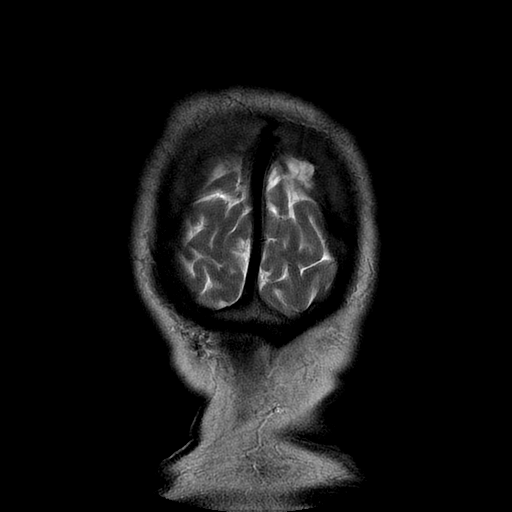
[im 14/28]
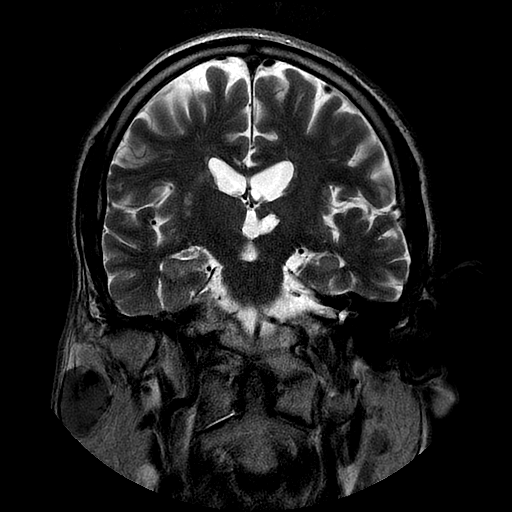
[im 28/28]
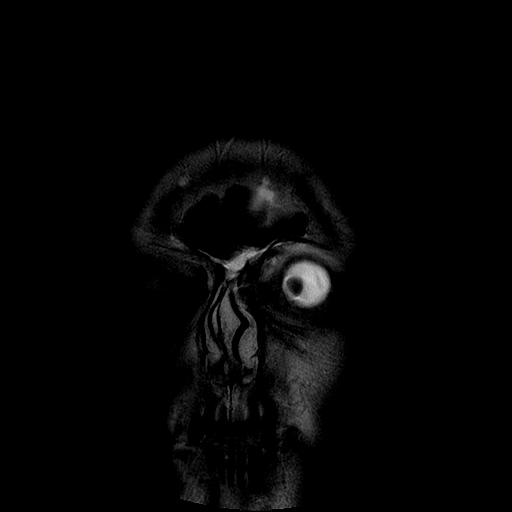

[Series 300: DWI · axial · 3.0mm · 1.09mm/px · z∈[-13,+125]mm · 5 of 47 slices shown (3 of 4)]
[im 1/47]
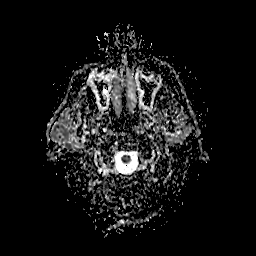
[im 12/47]
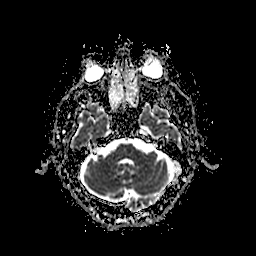
[im 24/47]
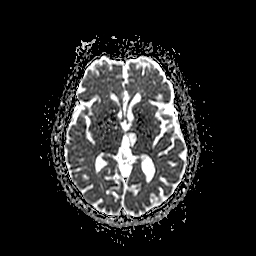
[im 35/47]
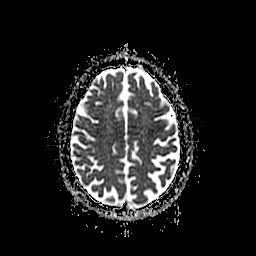
[im 47/47]
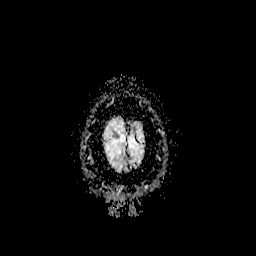

[Series 400: DWI · coronal · 5.0mm · 1.09mm/px · 3 of 33 slices shown (4 of 4)]
[im 1/33]
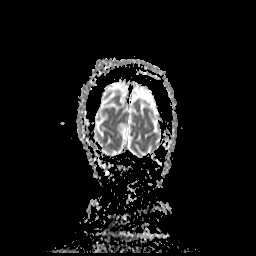
[im 17/33]
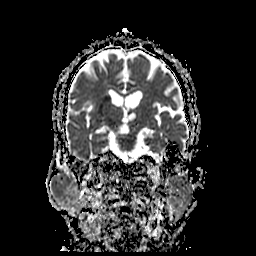
[im 33/33]
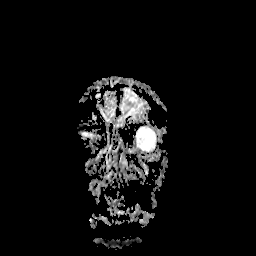

[35 of 48 positions shown; findings below may reference images not displayed]

FINDINGS: Acute nonhemorrhagic infarct extends from the mid to posterior right
lenticular nucleus/right external capsule to the corona radiata.

No intracranial hemorrhage.

Remote left thalamic infarct.

Mild chronic small vessel disease.

Mild global atrophy without hydrocephalus.

Enlarging bilateral parotid masses larger on the right (measuring up
to 3.5 cm) suspicious for tumor. ENT consultation recommended.

Major intracranial vascular structures are patent.

Prominent opacification ethmoid sinus air cells. Mild to slightly
moderate mucosal thickening maxillary sinuses bilaterally.

Cervical medullary junction, pituitary region, pineal region and
orbital structures unremarkable.
IMPRESSION: Acute nonhemorrhagic infarct extends from the mid to posterior right
lenticular nucleus/right external capsule to the corona radiata.

Remote left thalamic infarct.

Mild chronic small vessel disease.

Mild global atrophy without hydrocephalus.

Enlarging bilateral parotid masses larger on the right (measuring up
to 3.5 cm) suspicious for tumor. ENT consultation recommended.

Major intracranial vascular structures are patent.

Prominent opacification ethmoid sinus air cells. Mild to slightly
moderate mucosal thickening maxillary sinuses bilaterally.

## 2016-06-28 ENCOUNTER — Ambulatory Visit (INDEPENDENT_AMBULATORY_CARE_PROVIDER_SITE_OTHER): Payer: Medicare Other | Admitting: Neurology

## 2016-06-28 ENCOUNTER — Encounter: Payer: Self-pay | Admitting: Neurology

## 2016-06-28 VITALS — BP 121/75 | HR 78 | Ht 70.0 in | Wt 206.8 lb

## 2016-06-28 DIAGNOSIS — I639 Cerebral infarction, unspecified: Secondary | ICD-10-CM | POA: Diagnosis not present

## 2016-06-28 DIAGNOSIS — I6381 Other cerebral infarction due to occlusion or stenosis of small artery: Secondary | ICD-10-CM

## 2016-06-28 DIAGNOSIS — R4189 Other symptoms and signs involving cognitive functions and awareness: Secondary | ICD-10-CM

## 2016-06-28 NOTE — Progress Notes (Signed)
[id number]    Provider:  Dr Jaynee Eagles Referring Provider: Lawerance Cruel, MD Primary Care Physician:  Melinda Crutch, MD  CC: stroke  Interval history 06/28/2016: Patient returns for follow up. He feels his memory is stable. No known history of dementia in the family as far as she knows his mom was 34, father died young at 45. He stopped the Donepezil. He is still playing golf, he feels his golf game has suffered. We reviewed the MRI images of a stroke again, strokes in this location can affect coordination in the left hand which is probably affecting his golf game. At this point he is a year out and likely will not further improve significantly but I did encourage him to continue playing golf.  Interval history 12/24/2015: He still feels foggy. Otherwise symptoms have resolved. No difficulty with memory, no short or long-term memory changes. He is still playing golf. He is tired during the day. Doesn't think he snores. He wakes up a lot at 3am and can't go back to sleep. He is more angry then before. Not sad. Doesn't think he snores. Not excessively tired during the day. Tried anti-depressants in the past and made him worse. Will start Aricept and see if it makes a difference as I am unclear on what he means by feeling foggy and he can;t really explain it, I wonder if he is having cognitive difficulty. Will check B12 and thyroid today.   HPI: Billy [name] is a 71 y.o. male here as a referral from Dr. Harrington Challenger for recent stroke. He feels the symptoms have resolved for the most part. When he lays down and watches TV he feels fine. But he feels like he is in a fog all the time. He is having difficulty playing golf. His shots aren't as good. He used to love to play golf. His anger level has gone up. He gets mad very quickly. Legs feel heavy as he is is walking  Reviewed notes, labs and imaging from outside physicians, which showed: Billy [name] is an 71 y.o. male with a past medical  history significant for HTN, smoking, ischemic subcortical infarcts, presented with his sister to the ED in early Jan 2017 for evaluation of imbalance, dysarthria, left face droop. He has had 3 strokes before but without ostensible residual deficits at this time. Stated that couple of days ago was playing golf and started noticing some imbalance, lack of coordination, " no felling right". Then, the night before he became aware of " slight slurring of my words" which became noticeable to family today. Further, his sister noted mild droopiness of his left face and patient went to see his primary care physician who confirmed such findings and sent patient to the ED. He denies associated HA, vertigo, double vision, difficulty swallowing, focal weakness, or vision impairment. Has been on plavix for secondary stroke prevention. MRI brain showed an acute infarct that extends from the mid to posterior right lenticular nucleus/right external capsule to the corona radiata. Last known well was unable to be determined. Patient was not administered TPA secondary to being out of the window. He was admitted for further evaluation and treatment.  IMAGING I have personally reviewed the radiological images below and agree with the radiology interpretations.  Ct Head Wo Contrast 06/26/2015 1. Similar findings of microvascular ischemic disease and old left thalamic lacunar infarct without acute intracranial process. 2. Progressive sinus disease as above. Correlation for symptoms of acute sinusitis is recommended.  Mr Billy [name] Head Wo Contrast 06/27/2015 1. No large or proximal vessel occlusion. No high-grade or correctable stenosis. 2. Mild atheromatous small vessel irregularity within the distal MCA and PCA branches bilaterally. Normal appearance of the large and medium sized vessels.   Mr Brain Wo Contrast (neuro Protocol) 06/26/2015 Acute nonhemorrhagic infarct extends from the mid to posterior right lenticular  nucleus/right external capsule to the corona radiata. Remote left thalamic infarct. Mild chronic small vessel disease. Mild global atrophy without hydrocephalus. Enlarging bilateral parotid masses larger on the right (measuring up to 3.5 cm) suspicious for tumor. ENT consultation recommended. Major intracranial vascular structures are patent. Prominent opacification ethmoid sinus air cells. Mild to slightly moderate mucosal thickening maxillary sinuses bilaterally.   Carotid Doppler  There is 1-39% bilateral ICA stenosis. Vertebral artery flow is antegrade.   2D Echocardiogram  - Left ventricle: The cavity size was normal. Systolic function wasnormal. The estimated ejection fraction was in the range of 55%to 60%. Wall motion was normal; there were no regional wallmotion abnormalities. - Left atrium: The atrium was moderately dilated. - Atrial septum: No defect or patent foramen ovale was identified.  Review of Systems: Patient complains of symptoms per HPI as well as the following symptoms: no CP, no SOB. Pertinent negatives per HPI. All others negative.   Social History   Social History  . Marital status: Widowed    Spouse name: N/A  . Number of children: N/A  . Years of education: N/A   Occupational History  . retired    Social History Main Topics  . Smoking status: Current Every Day Smoker    Packs/day: 0.50    Years: 10.00    Types: Cigarettes  . Smokeless tobacco: Not on file  . Alcohol use No  . Drug use: No  . Sexual activity: Not on file   Other Topics Concern  . Not on file   Social History Narrative   Wife is deceased.   Retired.    Family History  Problem Relation Age of Onset  . Breast cancer Mother   . Cirrhosis Father     Past Medical History:  Diagnosis Date  . Headache   . Hypertension   . Stroke ([place])   . Tobacco abuse     No past surgical history on file.  Current Outpatient Prescriptions  Medication Sig Dispense Refill  .  amLODipine (NORVASC) 5 MG tablet Take 5 mg by mouth at bedtime.    Marland Kitchen atorvastatin (LIPITOR) 40 MG tablet Take 1 tablet (40 mg total) by mouth daily at 6 PM. 30 tablet 0  . clopidogrel (PLAVIX) 75 MG tablet Take 75 mg by mouth at bedtime.   1  . donepezil (ARICEPT) 10 MG tablet Take 1 tablet (10 mg total) by mouth at bedtime. 90 tablet 4  . Multiple Vitamin (MULTIVITAMIN WITH MINERALS) TABS tablet Take 1 tablet by mouth at bedtime.    . Multiple Vitamins-Minerals (PRESERVISION AREDS 2) CAPS Take 1 capsule by mouth at bedtime.    . tamsulosin (FLOMAX) 0.4 MG CAPS capsule Take 0.8 mg by mouth at bedtime.  1   No current facility-administered medications for this visit.     Allergies as of 06/28/2016 - Review Complete 12/24/2015  Allergen Reaction Noted  . Morphine and related Nausea And Vomiting 12/09/2014    Vitals: There were no vitals taken for this visit. Last Weight:  Wt Readings from Last 1 Encounters:  12/24/15 205 lb (93 kg)   Last Height:   Ht  Readings from Last 1 Encounters:  12/24/15 5\' 10"  (1.778 m)    Billy [name] 06/28/2016  Orientation to time 4  Orientation to Place 5  Registration 3  Attention/ Calculation 5  Recall 3  Language- name 2 objects 2  Language- repeat 1  Language- follow 3 step command 3  Language- read & follow direction 1  Write a sentence 1  Copy design 0  Total score 28   Physical [name]: [name]: Gen: NAD, conversant  Eyes: Conjunctivae clear without exudates or hemorrhage  Neuro: Detailed Neurologic [name]  Speech:  Slightly dysarthric Cognition:  The patient is oriented to person, place, and time;  Cranial Nerves:  The pupils are equal, round, and reactive to light. Visual fields are full to finger confrontation. Extraocular movements are intact. Trigeminal sensation is intact and the muscles of mastication are normal. Slight asymmetry of lids and very mild left NS flattening. The palate elevates in the  midline. Hearing intact to voice. Voice is normal. Shoulder shrug is normal. The tongue has normal motion without fasciculations.   Coordination:  Normal finger to nose and heel to shin. Fine motor movements intact in the hands.  Gait:  No ataxia  Motor Observation:  No asymmetry, no atrophy, and no involuntary movements noted. Tone:  Normal muscle tone.   Posture:  Posture is normal. normal erect   Strength:  Strength is V/V in the upper and lower limbs.    Sensation: intact to LT     [name] is a 71 y.o. male with history of previous stroke due to small-vessel disease, HTN, HLD and smoking presented with imbalance, dysarthria, left face droop in January 2017. Patient here for follow up on stroke as well as memory loss/changes which he feels are stable. Patient's Billy was 28/30.   In January 2017 patient had a  right lenticular nucleus/external capsule to corona radiata infarct secondary to small vessel disease source, he was treated with dual antiplatelets x 3 months, then plavix alone according to [facility]. Also with old L thalamic infarct.    MRA Unremarkable   Carotid Doppler No significant stenosis   2D Echo No source of embolus  LDL 114, on Lipitor goal < 70 in 06/2015, last ldl was 53 in 12/2015  HgbA1c pending  History of stroke  08/25/14 - midbrain stroke  08/28/14 - left thalamus  2010 - MRI negative but right sided weakness - ? DWI negative stroke  I had a long d/w patient about strokes, risk for recurrent stroke/TIAs, personally independently reviewed imaging studies and stroke evaluation results and answered questions.Continue Plavix for secondary stroke prevention and maintain strict control of hypertension with blood pressure goal below 130/90, diabetes with hemoglobin A1c goal below 6.5% and lipids with LDL cholesterol goal below 70 mg/dL. I also advised the patient to eat a healthy diet with plenty of  whole grains, cereals, fruits and vegetables, exercise regularly, stop smoking and maintain ideal body weight .   CC: Dr. Lovena Le, MD  [facility] [address] [place], [place] [address]  Phone [phone] Fax [phone]  A total of 25 minutes was spent face-to-face with this patient. Over half this time was spent on counseling patient on the stroke and cognitive changes diagnosis and different diagnostic and therapeutic options available.

## 2016-06-28 NOTE — Patient Instructions (Addendum)
Remember to drink plenty of fluid, eat healthy meals and do not skip any meals. Try to eat protein with a every meal and eat a healthy snack such as fruit or nuts in between meals. Try to keep a regular sleep-wake schedule and try to exercise daily, particularly in the form of walking, 20-30 minutes a day, if you can.   As far as your medications are concerned, I would like to suggest: Continue plavix and Lipitor  I would like to see you back in 1 year, sooner if we need to. Please call us with any interim questions, concerns, problems, updates or refill requests.   Our phone number is [phone]. We also have an after hours call service for urgent matters and there is a physician on-call for urgent questions. For any emergencies you know to call 911 or go to the nearest emergency room

## 2016-06-29 ENCOUNTER — Encounter: Payer: Self-pay | Admitting: Neurology

## 2016-08-27 DIAGNOSIS — Z125 Encounter for screening for malignant neoplasm of prostate: Secondary | ICD-10-CM | POA: Diagnosis not present

## 2016-08-27 DIAGNOSIS — I10 Essential (primary) hypertension: Secondary | ICD-10-CM | POA: Diagnosis not present

## 2016-08-27 DIAGNOSIS — E7800 Pure hypercholesterolemia, unspecified: Secondary | ICD-10-CM | POA: Diagnosis not present

## 2016-08-30 DIAGNOSIS — E7800 Pure hypercholesterolemia, unspecified: Secondary | ICD-10-CM | POA: Diagnosis not present

## 2016-08-30 DIAGNOSIS — R7309 Other abnormal glucose: Secondary | ICD-10-CM | POA: Diagnosis not present

## 2016-08-30 DIAGNOSIS — J449 Chronic obstructive pulmonary disease, unspecified: Secondary | ICD-10-CM | POA: Diagnosis not present

## 2016-08-30 DIAGNOSIS — I679 Cerebrovascular disease, unspecified: Secondary | ICD-10-CM | POA: Diagnosis not present

## 2016-08-30 DIAGNOSIS — Z0000 Encounter for general adult medical examination without abnormal findings: Secondary | ICD-10-CM | POA: Diagnosis not present

## 2016-08-30 DIAGNOSIS — I10 Essential (primary) hypertension: Secondary | ICD-10-CM | POA: Diagnosis not present

## 2017-02-07 DIAGNOSIS — Z85828 Personal history of other malignant neoplasm of skin: Secondary | ICD-10-CM | POA: Diagnosis not present

## 2017-02-07 DIAGNOSIS — D485 Neoplasm of uncertain behavior of skin: Secondary | ICD-10-CM | POA: Diagnosis not present

## 2017-02-07 DIAGNOSIS — D0472 Carcinoma in situ of skin of left lower limb, including hip: Secondary | ICD-10-CM | POA: Diagnosis not present

## 2017-02-07 DIAGNOSIS — D0471 Carcinoma in situ of skin of right lower limb, including hip: Secondary | ICD-10-CM | POA: Diagnosis not present

## 2017-03-02 DIAGNOSIS — I10 Essential (primary) hypertension: Secondary | ICD-10-CM | POA: Diagnosis not present

## 2017-03-02 DIAGNOSIS — R7309 Other abnormal glucose: Secondary | ICD-10-CM | POA: Diagnosis not present

## 2017-03-02 DIAGNOSIS — Z23 Encounter for immunization: Secondary | ICD-10-CM | POA: Diagnosis not present

## 2017-03-21 DIAGNOSIS — L570 Actinic keratosis: Secondary | ICD-10-CM | POA: Diagnosis not present

## 2017-03-21 DIAGNOSIS — D485 Neoplasm of uncertain behavior of skin: Secondary | ICD-10-CM | POA: Diagnosis not present

## 2017-03-21 DIAGNOSIS — Z85828 Personal history of other malignant neoplasm of skin: Secondary | ICD-10-CM | POA: Diagnosis not present

## 2017-03-21 DIAGNOSIS — L821 Other seborrheic keratosis: Secondary | ICD-10-CM | POA: Diagnosis not present

## 2017-03-21 DIAGNOSIS — L281 Prurigo nodularis: Secondary | ICD-10-CM | POA: Diagnosis not present

## 2017-03-21 DIAGNOSIS — L814 Other melanin hyperpigmentation: Secondary | ICD-10-CM | POA: Diagnosis not present

## 2017-03-22 DIAGNOSIS — E1165 Type 2 diabetes mellitus with hyperglycemia: Secondary | ICD-10-CM | POA: Diagnosis not present

## 2017-03-22 DIAGNOSIS — I10 Essential (primary) hypertension: Secondary | ICD-10-CM | POA: Diagnosis not present

## 2017-04-11 ENCOUNTER — Encounter: Payer: Medicare Other | Attending: Family Medicine | Admitting: Registered"

## 2017-04-11 ENCOUNTER — Encounter: Payer: Self-pay | Admitting: Registered"

## 2017-04-11 DIAGNOSIS — Z8673 Personal history of transient ischemic attack (TIA), and cerebral infarction without residual deficits: Secondary | ICD-10-CM | POA: Insufficient documentation

## 2017-04-11 DIAGNOSIS — Z713 Dietary counseling and surveillance: Secondary | ICD-10-CM | POA: Insufficient documentation

## 2017-04-11 DIAGNOSIS — E119 Type 2 diabetes mellitus without complications: Secondary | ICD-10-CM | POA: Diagnosis not present

## 2017-04-11 NOTE — Patient Instructions (Signed)
Plan:  Aim for 4-5 Carb Choices per meal (60-75 grams)   Aim for 0-2 Carbs per snack if hungry  Include protein with your meals and snacks Consider reading food labels for Total Carbohydrates Aim to eat balanced meals and snacks (protein/carb)  Avoid skipping meals Can use [employer] high protein milk to get more protein

## 2017-04-11 NOTE — Progress Notes (Signed)
Diabetes Self-Management Education  Visit Type: First/Initial  Appt. Start Time: 0930 Appt. End Time: 1100  04/12/2017  Billy [name], identified by name and date of birth, is a 71 y.o. male with a diagnosis of Diabetes: Type 2.   ASSESSMENT Pt appears to be hard of hearing. Pt states he had a stroke 3-4 yrs ago and lost his sense of taste and sometimes will forget to eat. Pt states he tried and didn't like Glucerna, which he chose that brand because that is what was available at [employer]. Pt states he lives alone, doesn't cook and about all he has in the house is cereal, nabs, bread and cheese.  RD provided nutritional supplement: Boost Optimum: Lot: [id number] Exp: 1.21.2019     Diabetes Self-Management Education - 04/11/17 0926      Visit Information   Visit Type First/Initial     Initial Visit   Diabetes Type Type 2   Are you currently following a meal plan? No   Are you taking your medications as prescribed? Not on Medications   Date Diagnosed 03/02/2017 per chart     Health Coping   How would you rate your overall health? Good     Psychosocial Assessment   Patient Belief/Attitude about Diabetes Motivated to manage diabetes   How often do you need to have someone help you when you read instructions, pamphlets, or other written materials from your doctor or pharmacy? 1 - Never   What is the last grade level you completed in school? 12     Complications   Last HgB A1C per patient/outside source 7.3 %  per outside source   How often do you check your blood sugar? 0 times/day (not testing)   Have you had a dilated eye exam in the past 12 months? Yes   Have you had a dental exam in the past 12 months? Yes   Are you checking your feet? Yes     Dietary Intake   Breakfast none OR cereal, coffee 3-4 week    Snack (morning) none OR nabs   Lunch outback, steak & salad   Snack (afternoon) nabs   Dinner cheese sandwich   Snack (evening) ice cream 5x week in summer, less  in winter   Beverage(s) flavored water, about 40-50 oz     Exercise   Exercise Type Light (walking / raking leaves)   How many days per week to you exercise? 3   How many minutes per day do you exercise? 60   Total minutes per week of exercise 180     Patient Education   Previous Diabetes Education No   Disease state  Definition of diabetes, type 1 and 2, and the diagnosis of diabetes   Nutrition management  Role of diet in the treatment of diabetes and the relationship between the three main macronutrients and blood glucose level;Food label reading, portion sizes and measuring food.;Carbohydrate counting   Physical activity and exercise  Role of exercise on diabetes management, blood pressure control and cardiac health.   Monitoring Identified appropriate SMBG and/or A1C goals.   Chronic complications Relationship between chronic complications and blood glucose control     Individualized Goals (developed by patient)   Nutrition General guidelines for healthy choices and portions discussed   Physical Activity Exercise 3-5 times per week     Outcomes   Expected Outcomes Demonstrated interest in learning. Expect positive outcomes   Future DMSE PRN   Program Status Completed    Individualized  Plan for Diabetes Self-Management Training:   Learning Objective:  Patient will have a greater understanding of diabetes self-management. Patient education plan is to attend individual and/or group sessions per assessed needs and concerns.  Patient Instructions  Plan:  Aim for 4-5 Carb Choices per meal (60-75 grams)   Aim for 0-2 Carbs per snack if hungry  Include protein with your meals and snacks Consider reading food labels for Total Carbohydrates Aim to eat balanced meals and snacks (protein/carb)  Avoid skipping meals Can use Fairlife high protein milk to get more protein   Expected Outcomes:  Demonstrated interest in learning. Expect positive outcomes  Education material provided:  Living Well with Diabetes, A1C conversion sheet, My Plate, Snack sheet and Carbohydrate counting sheet  If problems or questions, patient to contact team via:  Phone  Future DSME appointment: PRN

## 2017-04-12 DIAGNOSIS — E119 Type 2 diabetes mellitus without complications: Secondary | ICD-10-CM | POA: Insufficient documentation

## 2017-04-27 ENCOUNTER — Ambulatory Visit: Payer: Medicare Other | Admitting: Podiatry

## 2017-04-28 ENCOUNTER — Ambulatory Visit: Payer: Medicare Other | Admitting: Podiatry

## 2017-05-25 ENCOUNTER — Encounter: Payer: Medicare Other | Attending: Family Medicine | Admitting: Registered"

## 2017-05-25 DIAGNOSIS — Z8673 Personal history of transient ischemic attack (TIA), and cerebral infarction without residual deficits: Secondary | ICD-10-CM | POA: Insufficient documentation

## 2017-05-25 DIAGNOSIS — Z713 Dietary counseling and surveillance: Secondary | ICD-10-CM | POA: Insufficient documentation

## 2017-05-25 DIAGNOSIS — E119 Type 2 diabetes mellitus without complications: Secondary | ICD-10-CM | POA: Insufficient documentation

## 2017-05-25 NOTE — Patient Instructions (Addendum)
Consider having tuna and crackers or bread for lunch sometimes Look for a protein drink at least 15 grams protein and not more than about 30 grams carbohydrates. Consider trying lowfat Fairlife milk on cereal to get the protein balance. Avoid skipping meals.

## 2017-05-25 NOTE — Progress Notes (Signed)
Diabetes Self-Management Education  Visit Type: Follow-up  Appt. Start Time: 0900 Appt. End Time: 0930  05/25/2017  [name], identified by name and date of birth, is a 71 y.o. male with a diagnosis of Diabetes: Type 2.   ASSESSMENT Patient presented with a slight limp and states cold weather causes pain in toes due to old injury. Patient states he has stopped taking the multivitamins and B complex around 3 weeks ago and has not noticed any difference. RD advised that eating a balanced diet will provide vitamins he needs. Pt states he always has low energy and vitamins did not make a difference either way.  Since last visit patient states he has stopped eating ice cream. Patient states he enjoys it but cannot control himself if it is in the house and is doing fine just not buying it now. Pt states one night he ate a can of lima beans and afterward did not sleep well and threw up in the morning then stomach hurt for a week. Pt states the stomach pain was not severe enough to alter his dietary intake during that time.  Patient states he may want to check his blood sugar to see if his changes are working but afraid he would get mad if he sees a really high number after giving up ice cream and then go binge on ice cream out of frustration.   Diabetes Self-Management Education - 05/25/17 0937      Visit Information   Visit Type  Follow-up      Initial Visit   Diabetes Type  Type 2      Complications   How often do you check your blood sugar?  0 times/day (not testing)      Dietary Intake   Breakfast  coffee, cereal    Snack (morning)  Premier Protein    Lunch  cereal OR eggs, bacon, grits at coffee shop    Snack (afternoon)  nuts & raisins OR graham crackers & PB    Dinner  steak & 1/2 potato OR [employer]    Snack (evening)  nuts & raisins (sometimes in bed)    Beverage(s)  flavored water      Exercise   Exercise Type  Light (walking / raking leaves)    How many days per week to  you exercise?  2    How many minutes per day do you exercise?  45    Total minutes per week of exercise  90      Patient Education   Nutrition management   Role of diet in the treatment of diabetes and the relationship between the three main macronutrients and blood glucose level reviewed    Monitoring  Purpose and frequency of SMBG.      Individualized Goals (developed by patient)   Nutrition  General guidelines for healthy choices and portions discussed    Monitoring   test my blood glucose as discussed if does not want to wait for next A1c      Patient Self-Evaluation of Goals - Patient rates self as meeting previously set goals (% of time)   Nutrition  50 - 75 %    Physical Activity  25 - 50% doesn't get out as much when cold      Outcomes   Expected Outcomes  Demonstrated interest in learning. Expect positive outcomes    Future DMSE  PRN    Program Status  Not Completed      Subsequent Visit  Since your last visit have you experienced any weight changes?  No change    Since your last visit, are you checking your blood glucose at least once a day?  No     Individualized Plan for Diabetes Self-Management Training:   Learning Objective:  Patient will have a greater understanding of diabetes self-management. Patient education plan is to attend individual and/or group sessions per assessed needs and concerns.  Patient Instructions  Consider having tuna and crackers or bread for lunch sometimes Look for a protein drink at least 15 grams protein and not more than about 30 grams carbohydrates. Consider trying lowfat Fairlife milk on cereal to get the protein balance. Avoid skipping meals.  Expected Outcomes:  Demonstrated interest in learning. Expect positive outcomes  Education material provided: A1C conversion sheet  If problems or questions, patient to contact team via:  Phone  Future DSME appointment: PRN

## 2017-06-27 DIAGNOSIS — Z85828 Personal history of other malignant neoplasm of skin: Secondary | ICD-10-CM | POA: Diagnosis not present

## 2017-06-27 DIAGNOSIS — L570 Actinic keratosis: Secondary | ICD-10-CM | POA: Diagnosis not present

## 2017-06-29 ENCOUNTER — Ambulatory Visit (INDEPENDENT_AMBULATORY_CARE_PROVIDER_SITE_OTHER): Payer: PPO | Admitting: Neurology

## 2017-06-29 ENCOUNTER — Encounter: Payer: Self-pay | Admitting: Neurology

## 2017-06-29 VITALS — BP 117/75 | HR 82 | Ht 70.5 in | Wt 209.0 lb

## 2017-06-29 DIAGNOSIS — I639 Cerebral infarction, unspecified: Secondary | ICD-10-CM | POA: Diagnosis not present

## 2017-06-29 NOTE — Progress Notes (Signed)
[facility]    Provider:  Dr Jaynee Eagles Referring Provider: Lawerance Cruel, MD Primary Care Physician:  Lawerance Cruel, MD  CC: stroke  Interval history June 29, 2017: Patient is doing well, still plays golf, memory stable, no new events, no new complaints.  He is a little bored especially when he cannot play golf due to the weather but otherwise patient feels as though he is doing well without anything new. Still on Plavix.   Interval history 06/28/2016: Patient returns for follow up. He feels his memory is stable. No known history of dementia in the family as far as she knows his mom was 61, father died young at 25. He stopped the Donepezil. He is still playing golf, he feels his golf game has suffered. We reviewed the MRI images of a stroke again, strokes in this location can affect coordination in the left hand which is probably affecting his golf game. At this point he is a year out and likely will not further improve significantly but I did encourage him to continue playing golf.  Interval history 12/24/2015: He still feels foggy. Otherwise symptoms have resolved. No difficulty with memory, no short or long-term memory changes. He is still playing golf. He is tired during the day. Doesn't think he snores. He wakes up a lot at 3am and can't go back to sleep. He is more angry then before. Not sad. Doesn't think he snores. Not excessively tired during the day. Tried anti-depressants in the past and made him worse. Will start Aricept and see if it makes a difference as I am unclear on what he means by feeling foggy and he can;t really explain it, I wonder if he is having cognitive difficulty. Will check B12 and thyroid today.   HPI: Billy [name] is a 72 y.o. male here as a referral from Dr. Harrington Challenger for recent stroke. He feels the symptoms have resolved for the most part. When he lays down and watches TV he feels fine. But he feels like he is in a fog all the time. He is  having difficulty playing golf. His shots aren't as good. He used to love to play golf. His anger level has gone up. He gets mad very quickly. Legs feel heavy as he is is walking  Reviewed notes, labs and imaging from outside physicians, which showed: Billy [name] is an 72 y.o. male with a past medical history significant for HTN, smoking, ischemic subcortical infarcts, presented with his sister to the ED in early Jan 2017 for evaluation of imbalance, dysarthria, left face droop. He has had 3 strokes before but without ostensible residual deficits at this time. Stated that couple of days ago was playing golf and started noticing some imbalance, lack of coordination, " no felling right". Then, the night before he became aware of " slight slurring of my words" which became noticeable to family today. Further, his sister noted mild droopiness of his left face and patient went to see his primary care physician who confirmed such findings and sent patient to the ED. He denies associated HA, vertigo, double vision, difficulty swallowing, focal weakness, or vision impairment. Has been on plavix for secondary stroke prevention. MRI brain showed an acute infarct that extends from the mid to posterior right lenticular nucleus/right external capsule to the corona radiata. Last known well was unable to be determined. Patient was not administered TPA secondary to being out of the window. He was admitted for further evaluation and treatment.  IMAGING I have personally reviewed the radiological images below and agree with the radiology interpretations.  Ct Head Wo Contrast 06/26/2015 1. Similar findings of microvascular ischemic disease and old left thalamic lacunar infarct without acute intracranial process. 2. Progressive sinus disease as above. Correlation for symptoms of acute sinusitis is recommended.   Billy [name] Head Wo Contrast 06/27/2015 1. No large or proximal vessel occlusion. No high-grade or  correctable stenosis. 2. Mild atheromatous small vessel irregularity within the distal MCA and PCA branches bilaterally. Normal appearance of the large and medium sized vessels.   Billy Brain Wo Contrast (neuro Protocol) 06/26/2015 Acute nonhemorrhagic infarct extends from the mid to posterior right lenticular nucleus/right external capsule to the corona radiata. Remote left thalamic infarct. Mild chronic small vessel disease. Mild global atrophy without hydrocephalus. Enlarging bilateral parotid masses larger on the right (measuring up to 3.5 cm) suspicious for tumor. ENT consultation recommended. Major intracranial vascular structures are patent. Prominent opacification ethmoid sinus air cells. Mild to slightly moderate mucosal thickening maxillary sinuses bilaterally.   Carotid Doppler There is 1-39% bilateral ICA stenosis. Vertebral artery flow is antegrade.   2D Echocardiogram  - Left ventricle: The cavity size was normal. Systolic function wasnormal. The estimated ejection fraction was in the range of 55%to 60%. Wall motion was normal; there were no regional wallmotion abnormalities. - Left atrium: The atrium was moderately dilated. - Atrial septum: No defect or patent foramen ovale was identified.  Review of Systems: Patient complains of symptoms per HPI as well as the following symptoms: no CP, no SOB. Pertinent negatives per HPI. All others negative.   Social History   Socioeconomic History  . Marital status: Widowed    Spouse name: Not on file  . Number of children: 0  . Years of education: 57  . Highest education level: Not on file  Social Needs  . Financial resource strain: Not on file  . Food insecurity - worry: Not on file  . Food insecurity - inability: Not on file  . Transportation needs - medical: Not on file  . Transportation needs - non-medical: Not on file  Occupational History  . Occupation: retired  Tobacco Use  . Smoking status: Current Every Day  Smoker    Packs/day: 0.50    Years: 10.00    Pack years: 5.00  . Smokeless tobacco: Former Systems developer    Quit date: 1990  . Tobacco comment: quit cigarettes in 2016; uses vape  Substance and Sexual Activity  . Alcohol use: No    Alcohol/week: 0.0 oz  . Drug use: No  . Sexual activity: Not on file  Other Topics Concern  . Not on file  Social History Narrative   Wife is deceased.   Retired.   Lives at home alone   Right handed   Drinks 1-2 cups of caffeine daily    Family History  Problem Relation Age of Onset  . Breast cancer Mother   . Cirrhosis Father     Past Medical History:  Diagnosis Date  . Headache   . Hypertension   . Stroke ([place])   . Tobacco abuse     Past Surgical History:  Procedure Laterality Date  . LUNG SURGERY  1999   between 1999-2006; for mesothelioma    Current Outpatient Medications  Medication Sig Dispense Refill  . amLODipine (NORVASC) 5 MG tablet Take 5 mg by mouth at bedtime.    Marland Kitchen atorvastatin (LIPITOR) 40 MG tablet Take 1 tablet (40 mg total) by mouth  daily at 6 PM. 30 tablet 0  . clopidogrel (PLAVIX) 75 MG tablet Take 75 mg by mouth at bedtime.   1  . Multiple Vitamins-Minerals (PRESERVISION AREDS 2) CAPS Take 1 capsule by mouth at bedtime.    . tamsulosin (FLOMAX) 0.4 MG CAPS capsule Take 0.8 mg by mouth at bedtime.  1   No current facility-administered medications for this visit.     Allergies as of 06/29/2017 - Review Complete 06/29/2017  Allergen Reaction Noted  . Morphine and related Nausea And Vomiting 12/09/2014    Vitals: BP 117/75 (BP Location: Right Arm, Patient Position: Sitting)   Pulse 82   Ht 5' 10.5" (1.791 m)   Wt 209 lb (94.8 kg)   BMI 29.56 kg/m  Last Weight:  Wt Readings from Last 1 Encounters:  06/29/17 209 lb (94.8 kg)   Last Height:   Ht Readings from Last 1 Encounters:  06/29/17 5' 10.5" (1.791 m)   Physical exam: Exam: Gen: NAD, conversant, well nourised, well groomed                     CV: RRR, no  MRG. No Carotid Bruits. No peripheral edema, warm, nontender Eyes: Conjunctivae clear without exudates or hemorrhage  Neuro: Detailed Neurologic Exam  Speech:    Speech is normal; fluent and spontaneous with normal comprehension.  Cognition:    The patient is oriented to person, place, and time;     recent and remote memory intact;     language fluent;     normal attention, concentration,     fund of knowledge Cranial Nerves:    The pupils are equal, round, and reactive to light. The fundi are normal and spontaneous venous pulsations are present. Visual fields are full to finger confrontation. Extraocular movements are intact. Trigeminal sensation is intact and the muscles of mastication are normal. The face is symmetric. The palate elevates in the midline. Hearing intact. Voice is normal. Shoulder shrug is normal. The tongue has normal motion without fasciculations.   Coordination:    Normal finger to nose and heel to shin. Normal rapid alternating movements.   Gait:    Heel-toe and tandem gait are normal.   Motor Observation:    No asymmetry, no atrophy, and no involuntary movements noted. Tone:    Normal muscle tone.    Posture:    Posture is normal. normal erect    Strength:    Strength is V/V in the upper and lower limbs.      Sensation: intact to LT     Reflex Exam:  DTR's:    Deep tendon reflexes in the upper and lower extremities are normal bilaterally.   Toes:    The toes are downgoing bilaterally.   Clonus:    Clonus is absent. MMSE - Mini Mental State Exam 06/29/2017 06/28/2016  Orientation to time 5 4  Orientation to Place 5 5  Registration 3 3  Attention/ Calculation 5 5  Recall 3 3  Language- name 2 objects 2 2  Language- repeat 1 1  Language- follow 3 step command 3 3  Language- read & follow direction 1 1  Write a sentence 1 1  Copy design 1 0  Total score 30 28       A/P: Billy [name] is a 72 y.o. male with history of previous stroke  due to small-vessel disease, HTN, HLD and smoking presented with imbalance, dysarthria, left face droop in January 2017. Patient here  for follow up on stroke as well as memory loss/changes which he feels are stable. Patient's MMSe was 30/30 today. Doing well.   In January 2017 patient had a  right lenticular nucleus/external capsule to corona radiata infarct secondary to small vessel disease source, he was treated with dual antiplatelets x 3 months, then plavix alone according to [facility] Trial. Also with old L thalamic infarct.    MRAUnremarkable   Carotid DopplerNo significant stenosis   2D EchoNo source of embolus  LDL114, on Lipitor goal < 70 in 06/2015, last ldl was 53 in 12/2015  HgbA1cpending  History of stroke  08/25/14 - midbrain stroke  08/28/14 - left thalamus  2010 - MRI negative but right sided weakness - ? DWI negative stroke  I had a long d/w patient about strokes, risk for recurrent stroke/TIAs, personally independently reviewed imaging studies and stroke evaluation results and answered questions.Continue Plavix for secondary stroke prevention and maintain strict control of hypertension with blood pressure goal below 130/90, diabetes with hemoglobin A1c goal below 6.5% and lipids with LDL cholesterol goal below 70 mg/dL. I also advised the patient to eat a healthy diet with plenty of whole grains, cereals, fruits and vegetables, exercise regularly, stop smoking and maintain ideal body weight .   CC: Dr. Lovena Le, MD  [facility] [address] [place], [place] [address]  Phone [phone] Fax [phone]  A total of 15 minutes was spent face-to-face with this patient. Over half this time was spent on counseling patient on the stroke diagnosis and different diagnostic and therapeutic options available.

## 2017-08-30 ENCOUNTER — Telehealth: Payer: Self-pay | Admitting: *Deleted

## 2017-08-30 NOTE — Telephone Encounter (Signed)
I spoke with pt and he stated he wanted an appt for an ingrown toenail to be taken care of. I told pt the he could soak the toe in 1/4 cup epsom salt to 1 qt warm water for 20 minutes daily, then cover with a neosporin bandaid until seen in office. Pt states understanding and is transferred to schedulers.

## 2017-08-30 NOTE — Telephone Encounter (Signed)
Pt states he has an ingrown toenail.

## 2017-09-13 ENCOUNTER — Encounter: Payer: Self-pay | Admitting: Podiatry

## 2017-09-13 ENCOUNTER — Ambulatory Visit (INDEPENDENT_AMBULATORY_CARE_PROVIDER_SITE_OTHER): Payer: PPO | Admitting: Podiatry

## 2017-09-13 DIAGNOSIS — B351 Tinea unguium: Secondary | ICD-10-CM | POA: Diagnosis not present

## 2017-09-13 DIAGNOSIS — L600 Ingrowing nail: Secondary | ICD-10-CM

## 2017-09-13 DIAGNOSIS — M79675 Pain in left toe(s): Secondary | ICD-10-CM

## 2017-09-13 DIAGNOSIS — M79674 Pain in right toe(s): Secondary | ICD-10-CM | POA: Diagnosis not present

## 2017-09-13 NOTE — Progress Notes (Signed)
Subjective:    Patient ID: Billy [name], male    DOB: 1946/01/20, 72 y.o.   MRN: [id number]  HPI [facility] presents the office today for concerns of toenails that are elongated and discolored he cannot trim himself as he cannot see his toenails and he also gets ingrown toenails to both corners of both big toes.  Denies any redness or drainage or any swelling.  He has no other concerns today.  No recent treatment.   Review of Systems  All other systems reviewed and are negative.  Past Medical History:  Diagnosis Date  . Headache   . Hypertension   . Stroke ([place])   . Tobacco abuse     Past Surgical History:  Procedure Laterality Date  . LUNG SURGERY  1999   between 1999-2006; for mesothelioma     Current Outpatient Medications:  .  amLODipine (NORVASC) 5 MG tablet, Take 5 mg by mouth at bedtime., Disp: , Rfl:  .  atorvastatin (LIPITOR) 40 MG tablet, Take 1 tablet (40 mg total) by mouth daily at 6 PM., Disp: 30 tablet, Rfl: 0 .  clopidogrel (PLAVIX) 75 MG tablet, Take 75 mg by mouth at bedtime. , Disp: , Rfl: 1 .  Multiple Vitamins-Minerals (PRESERVISION AREDS 2) CAPS, Take 1 capsule by mouth at bedtime., Disp: , Rfl:  .  tamsulosin (FLOMAX) 0.4 MG CAPS capsule, Take 0.8 mg by mouth at bedtime., Disp: , Rfl: 1  Allergies  Allergen Reactions  . Morphine And Related Nausea And Vomiting    Social History   Socioeconomic History  . Marital status: Widowed    Spouse name: Not on file  . Number of children: 0  . Years of education: 46  . Highest education level: Not on file  Occupational History  . Occupation: retired  Scientific laboratory technician  . Financial resource strain: Not on file  . Food insecurity:    Worry: Not on file    Inability: Not on file  . Transportation needs:    Medical: Not on file    Non-medical: Not on file  Tobacco Use  . Smoking status: Current Every Day Smoker    Packs/day: 0.50    Years: 10.00    Pack years: 5.00  . Smokeless tobacco: Former Systems developer    Quit date: 1990  . Tobacco comment: quit cigarettes in 2016; uses vape  Substance and Sexual Activity  . Alcohol use: No    Alcohol/week: 0.0 oz  . Drug use: No  . Sexual activity: Not on file  Lifestyle  . Physical activity:    Days per week: Not on file    Minutes per session: Not on file  . Stress: Not on file  Relationships  . Social connections:    Talks on phone: Not on file    Gets together: Not on file    Attends religious service: Not on file    Active member of club or organization: Not on file    Attends meetings of clubs or organizations: Not on file    Relationship status: Not on file  . Intimate partner violence:    Fear of current or ex partner: Not on file    Emotionally abused: Not on file    Physically abused: Not on file    Forced sexual activity: Not on file  Other Topics Concern  . Not on file  Social History Narrative   Wife is deceased.   Retired.   Lives at home alone   Right  handed   Drinks 1-2 cups of caffeine daily        Objective:   Physical Exam  General: AAO x3, NAD  Dermatological: Nails are mildly hypertrophic, dystrophic, brittle, discolored, elongated 10.  Incurvation of both the medial lateral aspects of bilateral hallux toenails there is tenderness over the distal portion of ingrown nails.  There is no signs of infection.  No surrounding redness or drainage. Tenderness nails 1-5 bilaterally. No open lesions or pre-ulcerative lesions are identified today.   Vascular: Dorsalis Pedis artery and Posterior Tibial artery pedal pulses are 2/4 bilateral with immedate capillary fill time.  There is no pain with calf compression, swelling, warmth, erythema.   Neruologic: Grossly intact via light touch bilateral.  Protective threshold with Semmes Wienstein monofilament intact to all pedal sites bilateral.   Musculoskeletal: No gross boney pedal deformities bilateral. No pain, crepitus, or limitation noted with foot and ankle range of motion  bilateral. Muscular strength 5/5 in all groups tested bilateral.  Gait: Unassisted, Nonantalgic.      Assessment & Plan:  72 year old male symptom medic onychomycosis, ingrown toenail -Treatment options discussed including all alternatives, risks, and complications -Etiology of symptoms were discussed -Nails debrided 10 without complications or bleeding.  Discussed partial nail avulsion but wishes to hold off on that today. -Daily foot inspection -Follow-up in 3 months or sooner if any problems arise. In the meantime, encouraged to call the office with any questions, concerns, change in symptoms.   Celesta Gentile, DPM

## 2017-09-23 DIAGNOSIS — Z1389 Encounter for screening for other disorder: Secondary | ICD-10-CM | POA: Diagnosis not present

## 2017-09-23 DIAGNOSIS — N400 Enlarged prostate without lower urinary tract symptoms: Secondary | ICD-10-CM | POA: Diagnosis not present

## 2017-09-23 DIAGNOSIS — J449 Chronic obstructive pulmonary disease, unspecified: Secondary | ICD-10-CM | POA: Diagnosis not present

## 2017-09-23 DIAGNOSIS — Z0000 Encounter for general adult medical examination without abnormal findings: Secondary | ICD-10-CM | POA: Diagnosis not present

## 2017-09-23 DIAGNOSIS — I10 Essential (primary) hypertension: Secondary | ICD-10-CM | POA: Diagnosis not present

## 2017-09-23 DIAGNOSIS — E7800 Pure hypercholesterolemia, unspecified: Secondary | ICD-10-CM | POA: Diagnosis not present

## 2017-09-23 DIAGNOSIS — R21 Rash and other nonspecific skin eruption: Secondary | ICD-10-CM | POA: Diagnosis not present

## 2017-09-23 DIAGNOSIS — L03019 Cellulitis of unspecified finger: Secondary | ICD-10-CM | POA: Diagnosis not present

## 2017-09-23 DIAGNOSIS — Z23 Encounter for immunization: Secondary | ICD-10-CM | POA: Diagnosis not present

## 2017-09-23 DIAGNOSIS — I679 Cerebrovascular disease, unspecified: Secondary | ICD-10-CM | POA: Diagnosis not present

## 2017-09-23 DIAGNOSIS — E1169 Type 2 diabetes mellitus with other specified complication: Secondary | ICD-10-CM | POA: Diagnosis not present

## 2017-09-26 DIAGNOSIS — I679 Cerebrovascular disease, unspecified: Secondary | ICD-10-CM | POA: Diagnosis not present

## 2017-09-26 DIAGNOSIS — L03019 Cellulitis of unspecified finger: Secondary | ICD-10-CM | POA: Diagnosis not present

## 2017-09-26 DIAGNOSIS — J449 Chronic obstructive pulmonary disease, unspecified: Secondary | ICD-10-CM | POA: Diagnosis not present

## 2017-09-26 DIAGNOSIS — R21 Rash and other nonspecific skin eruption: Secondary | ICD-10-CM | POA: Diagnosis not present

## 2017-09-26 DIAGNOSIS — E7800 Pure hypercholesterolemia, unspecified: Secondary | ICD-10-CM | POA: Diagnosis not present

## 2017-09-26 DIAGNOSIS — N400 Enlarged prostate without lower urinary tract symptoms: Secondary | ICD-10-CM | POA: Diagnosis not present

## 2017-09-26 DIAGNOSIS — E1169 Type 2 diabetes mellitus with other specified complication: Secondary | ICD-10-CM | POA: Diagnosis not present

## 2017-09-26 DIAGNOSIS — I10 Essential (primary) hypertension: Secondary | ICD-10-CM | POA: Diagnosis not present

## 2017-09-26 DIAGNOSIS — Z0000 Encounter for general adult medical examination without abnormal findings: Secondary | ICD-10-CM | POA: Diagnosis not present

## 2017-10-05 DIAGNOSIS — H353132 Nonexudative age-related macular degeneration, bilateral, intermediate dry stage: Secondary | ICD-10-CM | POA: Diagnosis not present

## 2017-10-05 DIAGNOSIS — H26491 Other secondary cataract, right eye: Secondary | ICD-10-CM | POA: Diagnosis not present

## 2017-10-24 DIAGNOSIS — L570 Actinic keratosis: Secondary | ICD-10-CM | POA: Diagnosis not present

## 2017-10-24 DIAGNOSIS — L905 Scar conditions and fibrosis of skin: Secondary | ICD-10-CM | POA: Diagnosis not present

## 2017-10-24 DIAGNOSIS — Z85828 Personal history of other malignant neoplasm of skin: Secondary | ICD-10-CM | POA: Diagnosis not present

## 2017-10-24 DIAGNOSIS — L565 Disseminated superficial actinic porokeratosis (DSAP): Secondary | ICD-10-CM | POA: Diagnosis not present

## 2017-10-24 DIAGNOSIS — D224 Melanocytic nevi of scalp and neck: Secondary | ICD-10-CM | POA: Diagnosis not present

## 2017-10-24 DIAGNOSIS — L918 Other hypertrophic disorders of the skin: Secondary | ICD-10-CM | POA: Diagnosis not present

## 2017-12-26 ENCOUNTER — Ambulatory Visit: Payer: PPO | Admitting: Podiatry

## 2018-01-06 DIAGNOSIS — D485 Neoplasm of uncertain behavior of skin: Secondary | ICD-10-CM | POA: Diagnosis not present

## 2018-01-06 DIAGNOSIS — I10 Essential (primary) hypertension: Secondary | ICD-10-CM | POA: Diagnosis not present

## 2018-01-06 DIAGNOSIS — E1169 Type 2 diabetes mellitus with other specified complication: Secondary | ICD-10-CM | POA: Diagnosis not present

## 2018-01-06 DIAGNOSIS — E7800 Pure hypercholesterolemia, unspecified: Secondary | ICD-10-CM | POA: Diagnosis not present

## 2018-02-24 ENCOUNTER — Ambulatory Visit (HOSPITAL_COMMUNITY)
Admission: RE | Admit: 2018-02-24 | Discharge: 2018-02-24 | Disposition: A | Discharge status: Home / Self Care | Payer: PPO | Source: Ambulatory Visit | Attending: Vascular Surgery | Admitting: Vascular Surgery

## 2018-02-24 ENCOUNTER — Other Ambulatory Visit (HOSPITAL_COMMUNITY): Payer: Self-pay | Admitting: Family Medicine

## 2018-02-24 DIAGNOSIS — Z23 Encounter for immunization: Secondary | ICD-10-CM | POA: Diagnosis not present

## 2018-02-24 DIAGNOSIS — M79661 Pain in right lower leg: Secondary | ICD-10-CM

## 2018-02-24 DIAGNOSIS — I679 Cerebrovascular disease, unspecified: Secondary | ICD-10-CM | POA: Diagnosis not present

## 2018-02-24 DIAGNOSIS — E7800 Pure hypercholesterolemia, unspecified: Secondary | ICD-10-CM | POA: Diagnosis not present

## 2018-02-24 DIAGNOSIS — M79669 Pain in unspecified lower leg: Secondary | ICD-10-CM | POA: Diagnosis not present

## 2018-04-10 DIAGNOSIS — L570 Actinic keratosis: Secondary | ICD-10-CM | POA: Diagnosis not present

## 2018-04-10 DIAGNOSIS — C44729 Squamous cell carcinoma of skin of left lower limb, including hip: Secondary | ICD-10-CM | POA: Diagnosis not present

## 2018-04-10 DIAGNOSIS — L812 Freckles: Secondary | ICD-10-CM | POA: Diagnosis not present

## 2018-04-10 DIAGNOSIS — D485 Neoplasm of uncertain behavior of skin: Secondary | ICD-10-CM | POA: Diagnosis not present

## 2018-04-10 DIAGNOSIS — Z85828 Personal history of other malignant neoplasm of skin: Secondary | ICD-10-CM | POA: Diagnosis not present

## 2018-04-10 DIAGNOSIS — L821 Other seborrheic keratosis: Secondary | ICD-10-CM | POA: Diagnosis not present

## 2018-04-24 DIAGNOSIS — J449 Chronic obstructive pulmonary disease, unspecified: Secondary | ICD-10-CM | POA: Diagnosis not present

## 2018-04-24 DIAGNOSIS — E1169 Type 2 diabetes mellitus with other specified complication: Secondary | ICD-10-CM | POA: Diagnosis not present

## 2018-06-28 DIAGNOSIS — J441 Chronic obstructive pulmonary disease with (acute) exacerbation: Secondary | ICD-10-CM | POA: Diagnosis not present

## 2018-06-28 DIAGNOSIS — J449 Chronic obstructive pulmonary disease, unspecified: Secondary | ICD-10-CM | POA: Diagnosis not present

## 2018-10-28 DIAGNOSIS — B029 Zoster without complications: Secondary | ICD-10-CM | POA: Diagnosis not present

## 2018-11-09 DIAGNOSIS — B0229 Other postherpetic nervous system involvement: Secondary | ICD-10-CM | POA: Diagnosis not present

## 2018-11-09 DIAGNOSIS — G4700 Insomnia, unspecified: Secondary | ICD-10-CM | POA: Diagnosis not present

## 2018-11-09 DIAGNOSIS — E7800 Pure hypercholesterolemia, unspecified: Secondary | ICD-10-CM | POA: Diagnosis not present

## 2018-11-09 DIAGNOSIS — E1169 Type 2 diabetes mellitus with other specified complication: Secondary | ICD-10-CM | POA: Diagnosis not present

## 2018-11-09 DIAGNOSIS — Z125 Encounter for screening for malignant neoplasm of prostate: Secondary | ICD-10-CM | POA: Diagnosis not present

## 2018-11-09 DIAGNOSIS — R202 Paresthesia of skin: Secondary | ICD-10-CM | POA: Diagnosis not present

## 2018-11-09 DIAGNOSIS — I10 Essential (primary) hypertension: Secondary | ICD-10-CM | POA: Diagnosis not present

## 2018-11-15 DIAGNOSIS — I679 Cerebrovascular disease, unspecified: Secondary | ICD-10-CM | POA: Diagnosis not present

## 2018-11-15 DIAGNOSIS — N400 Enlarged prostate without lower urinary tract symptoms: Secondary | ICD-10-CM | POA: Diagnosis not present

## 2018-11-15 DIAGNOSIS — I10 Essential (primary) hypertension: Secondary | ICD-10-CM | POA: Diagnosis not present

## 2018-11-15 DIAGNOSIS — J449 Chronic obstructive pulmonary disease, unspecified: Secondary | ICD-10-CM | POA: Diagnosis not present

## 2018-11-15 DIAGNOSIS — Z0000 Encounter for general adult medical examination without abnormal findings: Secondary | ICD-10-CM | POA: Diagnosis not present

## 2018-11-15 DIAGNOSIS — G4700 Insomnia, unspecified: Secondary | ICD-10-CM | POA: Diagnosis not present

## 2018-11-15 DIAGNOSIS — B0229 Other postherpetic nervous system involvement: Secondary | ICD-10-CM | POA: Diagnosis not present

## 2018-11-15 DIAGNOSIS — E7800 Pure hypercholesterolemia, unspecified: Secondary | ICD-10-CM | POA: Diagnosis not present

## 2018-11-15 DIAGNOSIS — E1169 Type 2 diabetes mellitus with other specified complication: Secondary | ICD-10-CM | POA: Diagnosis not present

## 2018-12-25 DIAGNOSIS — J449 Chronic obstructive pulmonary disease, unspecified: Secondary | ICD-10-CM | POA: Diagnosis not present

## 2018-12-25 DIAGNOSIS — G4700 Insomnia, unspecified: Secondary | ICD-10-CM | POA: Diagnosis not present

## 2018-12-25 DIAGNOSIS — B0229 Other postherpetic nervous system involvement: Secondary | ICD-10-CM | POA: Diagnosis not present

## 2019-01-30 DIAGNOSIS — Z03818 Encounter for observation for suspected exposure to other biological agents ruled out: Secondary | ICD-10-CM | POA: Diagnosis not present

## 2019-02-01 DIAGNOSIS — H353132 Nonexudative age-related macular degeneration, bilateral, intermediate dry stage: Secondary | ICD-10-CM | POA: Diagnosis not present

## 2019-02-01 DIAGNOSIS — H524 Presbyopia: Secondary | ICD-10-CM | POA: Diagnosis not present

## 2019-02-01 DIAGNOSIS — J449 Chronic obstructive pulmonary disease, unspecified: Secondary | ICD-10-CM | POA: Diagnosis not present

## 2019-02-01 DIAGNOSIS — E119 Type 2 diabetes mellitus without complications: Secondary | ICD-10-CM | POA: Diagnosis not present

## 2019-02-01 DIAGNOSIS — Z961 Presence of intraocular lens: Secondary | ICD-10-CM | POA: Diagnosis not present

## 2019-03-02 DIAGNOSIS — R0602 Shortness of breath: Secondary | ICD-10-CM | POA: Diagnosis not present

## 2019-03-02 DIAGNOSIS — R0981 Nasal congestion: Secondary | ICD-10-CM | POA: Diagnosis not present

## 2019-03-02 DIAGNOSIS — J449 Chronic obstructive pulmonary disease, unspecified: Secondary | ICD-10-CM | POA: Diagnosis not present

## 2019-03-26 DIAGNOSIS — L570 Actinic keratosis: Secondary | ICD-10-CM | POA: Diagnosis not present

## 2019-03-26 DIAGNOSIS — C44319 Basal cell carcinoma of skin of other parts of face: Secondary | ICD-10-CM | POA: Diagnosis not present

## 2019-03-26 DIAGNOSIS — Z85828 Personal history of other malignant neoplasm of skin: Secondary | ICD-10-CM | POA: Diagnosis not present

## 2019-03-26 DIAGNOSIS — D1801 Hemangioma of skin and subcutaneous tissue: Secondary | ICD-10-CM | POA: Diagnosis not present

## 2019-03-26 DIAGNOSIS — C4441 Basal cell carcinoma of skin of scalp and neck: Secondary | ICD-10-CM | POA: Diagnosis not present

## 2019-03-26 DIAGNOSIS — L814 Other melanin hyperpigmentation: Secondary | ICD-10-CM | POA: Diagnosis not present

## 2019-03-26 DIAGNOSIS — L820 Inflamed seborrheic keratosis: Secondary | ICD-10-CM | POA: Diagnosis not present

## 2019-03-26 DIAGNOSIS — D485 Neoplasm of uncertain behavior of skin: Secondary | ICD-10-CM | POA: Diagnosis not present

## 2019-03-26 DIAGNOSIS — L821 Other seborrheic keratosis: Secondary | ICD-10-CM | POA: Diagnosis not present

## 2019-04-10 DIAGNOSIS — J310 Chronic rhinitis: Secondary | ICD-10-CM | POA: Diagnosis not present

## 2019-04-10 DIAGNOSIS — Z87891 Personal history of nicotine dependence: Secondary | ICD-10-CM | POA: Diagnosis not present

## 2019-04-10 DIAGNOSIS — R0981 Nasal congestion: Secondary | ICD-10-CM | POA: Diagnosis not present

## 2019-04-18 DIAGNOSIS — Z85828 Personal history of other malignant neoplasm of skin: Secondary | ICD-10-CM | POA: Diagnosis not present

## 2019-04-18 DIAGNOSIS — C44319 Basal cell carcinoma of skin of other parts of face: Secondary | ICD-10-CM | POA: Diagnosis not present

## 2019-05-21 DIAGNOSIS — E1169 Type 2 diabetes mellitus with other specified complication: Secondary | ICD-10-CM | POA: Diagnosis not present

## 2019-05-21 DIAGNOSIS — Z1211 Encounter for screening for malignant neoplasm of colon: Secondary | ICD-10-CM | POA: Diagnosis not present

## 2019-05-21 DIAGNOSIS — B0229 Other postherpetic nervous system involvement: Secondary | ICD-10-CM | POA: Diagnosis not present

## 2019-05-28 DIAGNOSIS — Z1211 Encounter for screening for malignant neoplasm of colon: Secondary | ICD-10-CM | POA: Diagnosis not present

## 2019-05-28 DIAGNOSIS — B0229 Other postherpetic nervous system involvement: Secondary | ICD-10-CM | POA: Diagnosis not present

## 2019-05-28 DIAGNOSIS — Z23 Encounter for immunization: Secondary | ICD-10-CM | POA: Diagnosis not present

## 2019-05-28 DIAGNOSIS — E1169 Type 2 diabetes mellitus with other specified complication: Secondary | ICD-10-CM | POA: Diagnosis not present

## 2019-07-18 DIAGNOSIS — I10 Essential (primary) hypertension: Secondary | ICD-10-CM | POA: Diagnosis not present

## 2019-07-18 DIAGNOSIS — N400 Enlarged prostate without lower urinary tract symptoms: Secondary | ICD-10-CM | POA: Diagnosis not present

## 2019-07-18 DIAGNOSIS — E1169 Type 2 diabetes mellitus with other specified complication: Secondary | ICD-10-CM | POA: Diagnosis not present

## 2019-07-18 DIAGNOSIS — J449 Chronic obstructive pulmonary disease, unspecified: Secondary | ICD-10-CM | POA: Diagnosis not present

## 2019-07-18 DIAGNOSIS — E7800 Pure hypercholesterolemia, unspecified: Secondary | ICD-10-CM | POA: Diagnosis not present

## 2019-07-30 DIAGNOSIS — E7800 Pure hypercholesterolemia, unspecified: Secondary | ICD-10-CM | POA: Diagnosis not present

## 2019-07-30 DIAGNOSIS — E1169 Type 2 diabetes mellitus with other specified complication: Secondary | ICD-10-CM | POA: Diagnosis not present

## 2019-07-30 DIAGNOSIS — I10 Essential (primary) hypertension: Secondary | ICD-10-CM | POA: Diagnosis not present

## 2019-07-30 DIAGNOSIS — J449 Chronic obstructive pulmonary disease, unspecified: Secondary | ICD-10-CM | POA: Diagnosis not present

## 2019-07-30 DIAGNOSIS — N400 Enlarged prostate without lower urinary tract symptoms: Secondary | ICD-10-CM | POA: Diagnosis not present

## 2019-08-17 ENCOUNTER — Ambulatory Visit: Payer: PPO | Attending: Internal Medicine

## 2019-08-17 DIAGNOSIS — Z23 Encounter for immunization: Secondary | ICD-10-CM

## 2019-08-17 NOTE — Progress Notes (Signed)
   Covid-19 Vaccination Clinic  Name:  Billy [name]    MRN: [id number] DOB: [birth year]  08/17/2019  Billy [name] was observed post Covid-19 immunization for 15 minutes without incidence. He was provided with Vaccine Information Sheet and instruction to access the V-Safe system.   Billy [name] was instructed to call 911 with any severe reactions post vaccine: Marland Kitchen Difficulty breathing  . Swelling of your face and throat  . A fast heartbeat  . A bad rash all over your body  . Dizziness and weakness    Immunizations Administered    Name Date Dose VIS Date Route   Pfizer COVID-19 Vaccine 08/17/2019  8:20 AM 0.3 mL 06/01/2019 Intramuscular   Manufacturer: [place]   Lot: [id number]   [place]: [id number]

## 2019-09-05 DIAGNOSIS — J449 Chronic obstructive pulmonary disease, unspecified: Secondary | ICD-10-CM | POA: Diagnosis not present

## 2019-09-05 DIAGNOSIS — E7800 Pure hypercholesterolemia, unspecified: Secondary | ICD-10-CM | POA: Diagnosis not present

## 2019-09-05 DIAGNOSIS — G4700 Insomnia, unspecified: Secondary | ICD-10-CM | POA: Diagnosis not present

## 2019-09-05 DIAGNOSIS — I10 Essential (primary) hypertension: Secondary | ICD-10-CM | POA: Diagnosis not present

## 2019-09-05 DIAGNOSIS — N400 Enlarged prostate without lower urinary tract symptoms: Secondary | ICD-10-CM | POA: Diagnosis not present

## 2019-09-05 DIAGNOSIS — E1169 Type 2 diabetes mellitus with other specified complication: Secondary | ICD-10-CM | POA: Diagnosis not present

## 2019-09-11 ENCOUNTER — Ambulatory Visit: Payer: PPO | Attending: Internal Medicine

## 2019-09-11 DIAGNOSIS — Z23 Encounter for immunization: Secondary | ICD-10-CM

## 2019-09-11 NOTE — Progress Notes (Signed)
   Covid-19 Vaccination Clinic  Name:  Billy [name]    MRN: [id number] DOB: 07/20/1945  09/11/2019  [name] was observed post Covid-19 immunization for 15 minutes without incident. He was provided with Vaccine Information Sheet and instruction to access the V-Safe system.   [name] was instructed to call 911 with any severe reactions post vaccine: Marland Kitchen Difficulty breathing  . Swelling of face and throat  . A fast heartbeat  . A bad rash all over body  . Dizziness and weakness   Immunizations Administered    Name Date Dose VIS Date Route   Pfizer COVID-19 Vaccine 09/11/2019  9:22 AM 0.3 mL 06/01/2019 Intramuscular   Manufacturer: [place]   Lot: [id number]   [place]: [id number]

## 2019-09-24 DIAGNOSIS — L821 Other seborrheic keratosis: Secondary | ICD-10-CM | POA: Diagnosis not present

## 2019-09-24 DIAGNOSIS — L280 Lichen simplex chronicus: Secondary | ICD-10-CM | POA: Diagnosis not present

## 2019-09-24 DIAGNOSIS — L570 Actinic keratosis: Secondary | ICD-10-CM | POA: Diagnosis not present

## 2019-09-24 DIAGNOSIS — D1801 Hemangioma of skin and subcutaneous tissue: Secondary | ICD-10-CM | POA: Diagnosis not present

## 2019-09-24 DIAGNOSIS — L812 Freckles: Secondary | ICD-10-CM | POA: Diagnosis not present

## 2019-09-24 DIAGNOSIS — Z85828 Personal history of other malignant neoplasm of skin: Secondary | ICD-10-CM | POA: Diagnosis not present

## 2019-10-02 DIAGNOSIS — E7800 Pure hypercholesterolemia, unspecified: Secondary | ICD-10-CM | POA: Diagnosis not present

## 2019-10-02 DIAGNOSIS — I10 Essential (primary) hypertension: Secondary | ICD-10-CM | POA: Diagnosis not present

## 2019-10-02 DIAGNOSIS — J449 Chronic obstructive pulmonary disease, unspecified: Secondary | ICD-10-CM | POA: Diagnosis not present

## 2019-10-02 DIAGNOSIS — E1169 Type 2 diabetes mellitus with other specified complication: Secondary | ICD-10-CM | POA: Diagnosis not present

## 2019-10-02 DIAGNOSIS — G4700 Insomnia, unspecified: Secondary | ICD-10-CM | POA: Diagnosis not present

## 2019-10-02 DIAGNOSIS — N400 Enlarged prostate without lower urinary tract symptoms: Secondary | ICD-10-CM | POA: Diagnosis not present

## 2019-11-26 DIAGNOSIS — Z0000 Encounter for general adult medical examination without abnormal findings: Secondary | ICD-10-CM | POA: Diagnosis not present

## 2019-11-26 DIAGNOSIS — I10 Essential (primary) hypertension: Secondary | ICD-10-CM | POA: Diagnosis not present

## 2019-11-26 DIAGNOSIS — G4700 Insomnia, unspecified: Secondary | ICD-10-CM | POA: Diagnosis not present

## 2019-11-26 DIAGNOSIS — N400 Enlarged prostate without lower urinary tract symptoms: Secondary | ICD-10-CM | POA: Diagnosis not present

## 2019-11-26 DIAGNOSIS — J449 Chronic obstructive pulmonary disease, unspecified: Secondary | ICD-10-CM | POA: Diagnosis not present

## 2019-11-26 DIAGNOSIS — E7800 Pure hypercholesterolemia, unspecified: Secondary | ICD-10-CM | POA: Diagnosis not present

## 2019-11-26 DIAGNOSIS — B0229 Other postherpetic nervous system involvement: Secondary | ICD-10-CM | POA: Diagnosis not present

## 2019-11-26 DIAGNOSIS — I679 Cerebrovascular disease, unspecified: Secondary | ICD-10-CM | POA: Diagnosis not present

## 2019-11-26 DIAGNOSIS — E1169 Type 2 diabetes mellitus with other specified complication: Secondary | ICD-10-CM | POA: Diagnosis not present

## 2020-01-30 DIAGNOSIS — M545 Low back pain: Secondary | ICD-10-CM | POA: Diagnosis not present

## 2020-02-13 DIAGNOSIS — Z961 Presence of intraocular lens: Secondary | ICD-10-CM | POA: Diagnosis not present

## 2020-02-13 DIAGNOSIS — E119 Type 2 diabetes mellitus without complications: Secondary | ICD-10-CM | POA: Diagnosis not present

## 2020-02-13 DIAGNOSIS — H353133 Nonexudative age-related macular degeneration, bilateral, advanced atrophic without subfoveal involvement: Secondary | ICD-10-CM | POA: Diagnosis not present

## 2020-03-03 DIAGNOSIS — J449 Chronic obstructive pulmonary disease, unspecified: Secondary | ICD-10-CM | POA: Diagnosis not present

## 2020-03-03 DIAGNOSIS — E1169 Type 2 diabetes mellitus with other specified complication: Secondary | ICD-10-CM | POA: Diagnosis not present

## 2020-03-03 DIAGNOSIS — G4700 Insomnia, unspecified: Secondary | ICD-10-CM | POA: Diagnosis not present

## 2020-03-03 DIAGNOSIS — I10 Essential (primary) hypertension: Secondary | ICD-10-CM | POA: Diagnosis not present

## 2020-03-03 DIAGNOSIS — N400 Enlarged prostate without lower urinary tract symptoms: Secondary | ICD-10-CM | POA: Diagnosis not present

## 2020-03-03 DIAGNOSIS — E7800 Pure hypercholesterolemia, unspecified: Secondary | ICD-10-CM | POA: Diagnosis not present

## 2020-03-25 DIAGNOSIS — L570 Actinic keratosis: Secondary | ICD-10-CM | POA: Diagnosis not present

## 2020-03-25 DIAGNOSIS — Z85828 Personal history of other malignant neoplasm of skin: Secondary | ICD-10-CM | POA: Diagnosis not present

## 2020-03-25 DIAGNOSIS — L281 Prurigo nodularis: Secondary | ICD-10-CM | POA: Diagnosis not present

## 2020-03-25 DIAGNOSIS — L821 Other seborrheic keratosis: Secondary | ICD-10-CM | POA: Diagnosis not present

## 2020-03-25 DIAGNOSIS — L812 Freckles: Secondary | ICD-10-CM | POA: Diagnosis not present

## 2020-03-25 DIAGNOSIS — D1801 Hemangioma of skin and subcutaneous tissue: Secondary | ICD-10-CM | POA: Diagnosis not present

## 2020-03-26 DIAGNOSIS — G4700 Insomnia, unspecified: Secondary | ICD-10-CM | POA: Diagnosis not present

## 2020-03-26 DIAGNOSIS — E7800 Pure hypercholesterolemia, unspecified: Secondary | ICD-10-CM | POA: Diagnosis not present

## 2020-03-26 DIAGNOSIS — N400 Enlarged prostate without lower urinary tract symptoms: Secondary | ICD-10-CM | POA: Diagnosis not present

## 2020-03-26 DIAGNOSIS — E1169 Type 2 diabetes mellitus with other specified complication: Secondary | ICD-10-CM | POA: Diagnosis not present

## 2020-03-26 DIAGNOSIS — J449 Chronic obstructive pulmonary disease, unspecified: Secondary | ICD-10-CM | POA: Diagnosis not present

## 2020-03-26 DIAGNOSIS — I10 Essential (primary) hypertension: Secondary | ICD-10-CM | POA: Diagnosis not present

## 2020-04-23 DIAGNOSIS — G4700 Insomnia, unspecified: Secondary | ICD-10-CM | POA: Diagnosis not present

## 2020-04-23 DIAGNOSIS — J449 Chronic obstructive pulmonary disease, unspecified: Secondary | ICD-10-CM | POA: Diagnosis not present

## 2020-04-23 DIAGNOSIS — E7800 Pure hypercholesterolemia, unspecified: Secondary | ICD-10-CM | POA: Diagnosis not present

## 2020-04-23 DIAGNOSIS — E1169 Type 2 diabetes mellitus with other specified complication: Secondary | ICD-10-CM | POA: Diagnosis not present

## 2020-04-23 DIAGNOSIS — I10 Essential (primary) hypertension: Secondary | ICD-10-CM | POA: Diagnosis not present

## 2020-04-23 DIAGNOSIS — N400 Enlarged prostate without lower urinary tract symptoms: Secondary | ICD-10-CM | POA: Diagnosis not present

## 2020-05-23 DIAGNOSIS — G4700 Insomnia, unspecified: Secondary | ICD-10-CM | POA: Diagnosis not present

## 2020-05-23 DIAGNOSIS — I10 Essential (primary) hypertension: Secondary | ICD-10-CM | POA: Diagnosis not present

## 2020-05-23 DIAGNOSIS — N400 Enlarged prostate without lower urinary tract symptoms: Secondary | ICD-10-CM | POA: Diagnosis not present

## 2020-05-23 DIAGNOSIS — E1169 Type 2 diabetes mellitus with other specified complication: Secondary | ICD-10-CM | POA: Diagnosis not present

## 2020-05-23 DIAGNOSIS — J449 Chronic obstructive pulmonary disease, unspecified: Secondary | ICD-10-CM | POA: Diagnosis not present

## 2020-05-23 DIAGNOSIS — E7800 Pure hypercholesterolemia, unspecified: Secondary | ICD-10-CM | POA: Diagnosis not present

## 2020-06-09 DIAGNOSIS — E1169 Type 2 diabetes mellitus with other specified complication: Secondary | ICD-10-CM | POA: Diagnosis not present

## 2020-06-09 DIAGNOSIS — D485 Neoplasm of uncertain behavior of skin: Secondary | ICD-10-CM | POA: Diagnosis not present

## 2020-06-11 DIAGNOSIS — Z85828 Personal history of other malignant neoplasm of skin: Secondary | ICD-10-CM | POA: Diagnosis not present

## 2020-06-11 DIAGNOSIS — D485 Neoplasm of uncertain behavior of skin: Secondary | ICD-10-CM | POA: Diagnosis not present

## 2020-06-11 DIAGNOSIS — C44722 Squamous cell carcinoma of skin of right lower limb, including hip: Secondary | ICD-10-CM | POA: Diagnosis not present

## 2020-06-27 DIAGNOSIS — E7800 Pure hypercholesterolemia, unspecified: Secondary | ICD-10-CM | POA: Diagnosis not present

## 2020-06-27 DIAGNOSIS — E1169 Type 2 diabetes mellitus with other specified complication: Secondary | ICD-10-CM | POA: Diagnosis not present

## 2020-06-27 DIAGNOSIS — G4700 Insomnia, unspecified: Secondary | ICD-10-CM | POA: Diagnosis not present

## 2020-06-27 DIAGNOSIS — I10 Essential (primary) hypertension: Secondary | ICD-10-CM | POA: Diagnosis not present

## 2020-06-27 DIAGNOSIS — N400 Enlarged prostate without lower urinary tract symptoms: Secondary | ICD-10-CM | POA: Diagnosis not present

## 2020-06-27 DIAGNOSIS — J449 Chronic obstructive pulmonary disease, unspecified: Secondary | ICD-10-CM | POA: Diagnosis not present

## 2020-07-29 DIAGNOSIS — N400 Enlarged prostate without lower urinary tract symptoms: Secondary | ICD-10-CM | POA: Diagnosis not present

## 2020-07-29 DIAGNOSIS — E1169 Type 2 diabetes mellitus with other specified complication: Secondary | ICD-10-CM | POA: Diagnosis not present

## 2020-07-29 DIAGNOSIS — I10 Essential (primary) hypertension: Secondary | ICD-10-CM | POA: Diagnosis not present

## 2020-07-29 DIAGNOSIS — E7800 Pure hypercholesterolemia, unspecified: Secondary | ICD-10-CM | POA: Diagnosis not present

## 2020-07-29 DIAGNOSIS — J449 Chronic obstructive pulmonary disease, unspecified: Secondary | ICD-10-CM | POA: Diagnosis not present

## 2020-07-29 DIAGNOSIS — G4700 Insomnia, unspecified: Secondary | ICD-10-CM | POA: Diagnosis not present

## 2020-08-13 DIAGNOSIS — L905 Scar conditions and fibrosis of skin: Secondary | ICD-10-CM | POA: Diagnosis not present

## 2020-08-13 DIAGNOSIS — B3749 Other urogenital candidiasis: Secondary | ICD-10-CM | POA: Diagnosis not present

## 2020-08-21 DIAGNOSIS — I10 Essential (primary) hypertension: Secondary | ICD-10-CM | POA: Diagnosis not present

## 2020-08-21 DIAGNOSIS — E1169 Type 2 diabetes mellitus with other specified complication: Secondary | ICD-10-CM | POA: Diagnosis not present

## 2020-08-21 DIAGNOSIS — J449 Chronic obstructive pulmonary disease, unspecified: Secondary | ICD-10-CM | POA: Diagnosis not present

## 2020-08-21 DIAGNOSIS — G4700 Insomnia, unspecified: Secondary | ICD-10-CM | POA: Diagnosis not present

## 2020-08-21 DIAGNOSIS — N400 Enlarged prostate without lower urinary tract symptoms: Secondary | ICD-10-CM | POA: Diagnosis not present

## 2020-08-21 DIAGNOSIS — E7800 Pure hypercholesterolemia, unspecified: Secondary | ICD-10-CM | POA: Diagnosis not present

## 2020-08-25 ENCOUNTER — Other Ambulatory Visit: Payer: Self-pay

## 2020-08-25 ENCOUNTER — Ambulatory Visit (INDEPENDENT_AMBULATORY_CARE_PROVIDER_SITE_OTHER): Payer: PPO | Admitting: Podiatry

## 2020-08-25 DIAGNOSIS — L600 Ingrowing nail: Secondary | ICD-10-CM | POA: Diagnosis not present

## 2020-08-25 DIAGNOSIS — M79675 Pain in left toe(s): Secondary | ICD-10-CM

## 2020-08-25 MED ORDER — CEPHALEXIN 500 MG PO CAPS: 500.0000 mg | ORAL_CAPSULE | Freq: Three times a day (TID) | ORAL | 0 refills | Status: DC

## 2020-08-25 NOTE — Patient Instructions (Signed)

## 2020-08-29 NOTE — Progress Notes (Signed)
Subjective: 75 year old male presents the office with concerns of ingrown toenail to left great toe, medial aspect which is nontender.  Sometimes he drainage or pus from the area source shoes.Denies any systemic complaints such as fevers, chills, nausea, vomiting. No acute changes since last appointment, and no other complaints at this time.   Objective: AAO x3, NAD DP/PT pulses palpable bilaterally, CRT less than 3 seconds Incurvation present to medial aspect left hallux toenail with localized edema and mild erythema likely more from inflammation as opposed to infection.  No drainage or pus or ascending cellulitis.  There is no other areas of tenderness identified today. No pain with calf compression, swelling, warmth, erythema  Assessment: Ingrown toenail left medial hallux nail border  Plan: -All treatment options discussed with the patient including all alternatives, risks, complications.  -At this time, the patient is requesting partial nail removal with chemical matricectomy to the symptomatic portion of the nail. Risks and complications were discussed with the patient for which they understand and written consent was obtained. Under sterile conditions a total of 3 mL of a mixture of 2% lidocaine plain and 0.5% Marcaine plain was infiltrated in a hallux block fashion. Once anesthetized, the skin was prepped in sterile fashion. A tourniquet was then applied. Next the medial aspect of hallux nail border was then sharply excised making sure to remove the entire offending nail border. Once the nails were ensured to be removed area was debrided and the underlying skin was intact. There is no purulence identified in the procedure. Next phenol was then applied under standard conditions and copiously irrigated. Silvadene was applied. A dry sterile dressing was applied. After application of the dressing the tourniquet was removed and there is found to be an immediate capillary refill time to the digit.  The patient tolerated the procedure well any complications. Post procedure instructions were discussed the patient for which he verbally understood. Follow-up in one week for nail check or sooner if any problems are to arise. Discussed signs/symptoms of infection and directed to call the office immediately should any occur or go directly to the emergency room. In the meantime, encouraged to call the office with any questions, concerns, changes symptoms. -Keflex -Patient encouraged to call the office with any questions, concerns, change in symptoms.   Trula Slade DPM

## 2020-09-08 ENCOUNTER — Ambulatory Visit: Payer: PPO | Admitting: Podiatry

## 2020-09-08 ENCOUNTER — Other Ambulatory Visit: Payer: Self-pay

## 2020-09-08 DIAGNOSIS — L600 Ingrowing nail: Secondary | ICD-10-CM

## 2020-09-10 DIAGNOSIS — L600 Ingrowing nail: Secondary | ICD-10-CM | POA: Insufficient documentation

## 2020-09-10 NOTE — Progress Notes (Signed)
Subjective: Billy [name] is a 75 y.o.  male returns to office today for follow up evaluation after having left Hallux partial nail avulsion performed.  He has not been soaking in Epson salt as he states that he cannot sit there for that long.  Patient denies fevers, chills, nausea, vomiting. Denies any calf pain, chest pain, SOB.   Objective:  General: Well developed, nourished, in no acute distress, alert and oriented x3   Dermatology: Skin is warm, dry and supple bilateral. LEFT hallux nail border appears to be clean, dry, with mild surrounding scab. There is no surrounding erythema, edema, drainage/purulence. The remaining nails appear unremarkable at this time. There are no other lesions or other signs of infection present.  Neurovascular status: Intact. No lower extremity swelling; No pain with calf compression bilateral.  Musculoskeletal: No tenderness to palpation of the left hallux nail fold. Muscular strength within normal limits bilateral.   Assesement and Plan: S/p partial nail avulsion, doing well.   -Continue soaking in epsom salts twice a day followed by antibiotic ointment and a band-aid. Can leave uncovered at night. Continue this until completely healed.  -If the area has not healed in 2 weeks, call the office for follow-up appointment, or sooner if any problems arise.  -Monitor for any signs/symptoms of infection. Call the office immediately if any occur or go directly to the emergency room. Call with any questions/concerns.  Celesta Gentile, DPM

## 2020-09-23 DIAGNOSIS — L821 Other seborrheic keratosis: Secondary | ICD-10-CM | POA: Diagnosis not present

## 2020-09-23 DIAGNOSIS — L578 Other skin changes due to chronic exposure to nonionizing radiation: Secondary | ICD-10-CM | POA: Diagnosis not present

## 2020-09-23 DIAGNOSIS — L570 Actinic keratosis: Secondary | ICD-10-CM | POA: Diagnosis not present

## 2020-09-23 DIAGNOSIS — C4371 Malignant melanoma of right lower limb, including hip: Secondary | ICD-10-CM | POA: Diagnosis not present

## 2020-09-23 DIAGNOSIS — D485 Neoplasm of uncertain behavior of skin: Secondary | ICD-10-CM | POA: Diagnosis not present

## 2020-09-23 DIAGNOSIS — Z85828 Personal history of other malignant neoplasm of skin: Secondary | ICD-10-CM | POA: Diagnosis not present

## 2020-10-07 DIAGNOSIS — D485 Neoplasm of uncertain behavior of skin: Secondary | ICD-10-CM | POA: Diagnosis not present

## 2020-10-15 DIAGNOSIS — C4371 Malignant melanoma of right lower limb, including hip: Secondary | ICD-10-CM | POA: Diagnosis not present

## 2020-11-07 DIAGNOSIS — Z79899 Other long term (current) drug therapy: Secondary | ICD-10-CM | POA: Diagnosis not present

## 2020-11-07 DIAGNOSIS — F17210 Nicotine dependence, cigarettes, uncomplicated: Secondary | ICD-10-CM | POA: Diagnosis not present

## 2020-11-07 DIAGNOSIS — E7800 Pure hypercholesterolemia, unspecified: Secondary | ICD-10-CM | POA: Diagnosis not present

## 2020-11-07 DIAGNOSIS — C4371 Malignant melanoma of right lower limb, including hip: Secondary | ICD-10-CM | POA: Diagnosis not present

## 2020-11-07 DIAGNOSIS — I10 Essential (primary) hypertension: Secondary | ICD-10-CM | POA: Diagnosis not present

## 2020-11-07 DIAGNOSIS — E119 Type 2 diabetes mellitus without complications: Secondary | ICD-10-CM | POA: Diagnosis not present

## 2020-11-07 DIAGNOSIS — Z7984 Long term (current) use of oral hypoglycemic drugs: Secondary | ICD-10-CM | POA: Diagnosis not present

## 2020-11-07 DIAGNOSIS — I69398 Other sequelae of cerebral infarction: Secondary | ICD-10-CM | POA: Diagnosis not present

## 2020-11-07 DIAGNOSIS — R438 Other disturbances of smell and taste: Secondary | ICD-10-CM | POA: Diagnosis not present

## 2020-11-07 DIAGNOSIS — L905 Scar conditions and fibrosis of skin: Secondary | ICD-10-CM | POA: Diagnosis not present

## 2020-11-07 DIAGNOSIS — Z885 Allergy status to narcotic agent status: Secondary | ICD-10-CM | POA: Diagnosis not present

## 2020-11-07 DIAGNOSIS — K219 Gastro-esophageal reflux disease without esophagitis: Secondary | ICD-10-CM | POA: Diagnosis not present

## 2020-11-07 DIAGNOSIS — C774 Secondary and unspecified malignant neoplasm of inguinal and lower limb lymph nodes: Secondary | ICD-10-CM | POA: Diagnosis not present

## 2020-11-07 DIAGNOSIS — E785 Hyperlipidemia, unspecified: Secondary | ICD-10-CM | POA: Diagnosis not present

## 2020-11-07 DIAGNOSIS — Z85828 Personal history of other malignant neoplasm of skin: Secondary | ICD-10-CM | POA: Diagnosis not present

## 2020-11-07 DIAGNOSIS — Z85820 Personal history of malignant melanoma of skin: Secondary | ICD-10-CM | POA: Diagnosis not present

## 2020-11-19 DIAGNOSIS — C4371 Malignant melanoma of right lower limb, including hip: Secondary | ICD-10-CM | POA: Diagnosis not present

## 2020-11-19 DIAGNOSIS — Z09 Encounter for follow-up examination after completed treatment for conditions other than malignant neoplasm: Secondary | ICD-10-CM | POA: Diagnosis not present

## 2020-11-23 DIAGNOSIS — E1169 Type 2 diabetes mellitus with other specified complication: Secondary | ICD-10-CM | POA: Diagnosis not present

## 2020-11-23 DIAGNOSIS — J449 Chronic obstructive pulmonary disease, unspecified: Secondary | ICD-10-CM | POA: Diagnosis not present

## 2020-11-23 DIAGNOSIS — I10 Essential (primary) hypertension: Secondary | ICD-10-CM | POA: Diagnosis not present

## 2020-11-23 DIAGNOSIS — E7800 Pure hypercholesterolemia, unspecified: Secondary | ICD-10-CM | POA: Diagnosis not present

## 2020-11-23 DIAGNOSIS — N400 Enlarged prostate without lower urinary tract symptoms: Secondary | ICD-10-CM | POA: Diagnosis not present

## 2020-11-23 DIAGNOSIS — G4700 Insomnia, unspecified: Secondary | ICD-10-CM | POA: Diagnosis not present

## 2020-11-26 DIAGNOSIS — J929 Pleural plaque without asbestos: Secondary | ICD-10-CM | POA: Diagnosis not present

## 2020-11-26 DIAGNOSIS — C4371 Malignant melanoma of right lower limb, including hip: Secondary | ICD-10-CM | POA: Diagnosis not present

## 2020-11-27 DIAGNOSIS — C4371 Malignant melanoma of right lower limb, including hip: Secondary | ICD-10-CM | POA: Diagnosis not present

## 2020-12-29 DIAGNOSIS — D37030 Neoplasm of uncertain behavior of the parotid salivary glands: Secondary | ICD-10-CM | POA: Diagnosis not present

## 2021-01-01 ENCOUNTER — Other Ambulatory Visit: Payer: Self-pay | Admitting: Otolaryngology

## 2021-01-14 DIAGNOSIS — Z0000 Encounter for general adult medical examination without abnormal findings: Secondary | ICD-10-CM | POA: Diagnosis not present

## 2021-01-14 DIAGNOSIS — Z1389 Encounter for screening for other disorder: Secondary | ICD-10-CM | POA: Diagnosis not present

## 2021-01-26 ENCOUNTER — Encounter (HOSPITAL_BASED_OUTPATIENT_CLINIC_OR_DEPARTMENT_OTHER): Payer: Self-pay

## 2021-01-26 ENCOUNTER — Ambulatory Visit (HOSPITAL_BASED_OUTPATIENT_CLINIC_OR_DEPARTMENT_OTHER): Admit: 2021-01-26 | Payer: PPO | Admitting: Otolaryngology

## 2021-01-26 SURGERY — EXCISION, PAROTID GLAND
Anesthesia: General | Laterality: Right

## 2021-02-17 DIAGNOSIS — E1169 Type 2 diabetes mellitus with other specified complication: Secondary | ICD-10-CM | POA: Diagnosis not present

## 2021-02-17 DIAGNOSIS — N400 Enlarged prostate without lower urinary tract symptoms: Secondary | ICD-10-CM | POA: Diagnosis not present

## 2021-02-17 DIAGNOSIS — G4700 Insomnia, unspecified: Secondary | ICD-10-CM | POA: Diagnosis not present

## 2021-02-17 DIAGNOSIS — I10 Essential (primary) hypertension: Secondary | ICD-10-CM | POA: Diagnosis not present

## 2021-02-17 DIAGNOSIS — J449 Chronic obstructive pulmonary disease, unspecified: Secondary | ICD-10-CM | POA: Diagnosis not present

## 2021-02-17 DIAGNOSIS — E7800 Pure hypercholesterolemia, unspecified: Secondary | ICD-10-CM | POA: Diagnosis not present

## 2021-02-24 DIAGNOSIS — E1169 Type 2 diabetes mellitus with other specified complication: Secondary | ICD-10-CM | POA: Diagnosis not present

## 2021-02-24 DIAGNOSIS — Z6829 Body mass index (BMI) 29.0-29.9, adult: Secondary | ICD-10-CM | POA: Diagnosis not present

## 2021-02-24 DIAGNOSIS — B0229 Other postherpetic nervous system involvement: Secondary | ICD-10-CM | POA: Diagnosis not present

## 2021-03-02 DIAGNOSIS — Z23 Encounter for immunization: Secondary | ICD-10-CM | POA: Diagnosis not present

## 2021-03-02 DIAGNOSIS — B0229 Other postherpetic nervous system involvement: Secondary | ICD-10-CM | POA: Diagnosis not present

## 2021-03-25 DIAGNOSIS — D1801 Hemangioma of skin and subcutaneous tissue: Secondary | ICD-10-CM | POA: Diagnosis not present

## 2021-03-25 DIAGNOSIS — L812 Freckles: Secondary | ICD-10-CM | POA: Diagnosis not present

## 2021-03-25 DIAGNOSIS — L853 Xerosis cutis: Secondary | ICD-10-CM | POA: Diagnosis not present

## 2021-03-25 DIAGNOSIS — L570 Actinic keratosis: Secondary | ICD-10-CM | POA: Diagnosis not present

## 2021-03-25 DIAGNOSIS — Z85820 Personal history of malignant melanoma of skin: Secondary | ICD-10-CM | POA: Diagnosis not present

## 2021-03-25 DIAGNOSIS — Z85828 Personal history of other malignant neoplasm of skin: Secondary | ICD-10-CM | POA: Diagnosis not present

## 2021-03-25 DIAGNOSIS — L821 Other seborrheic keratosis: Secondary | ICD-10-CM | POA: Diagnosis not present

## 2021-03-25 DIAGNOSIS — L565 Disseminated superficial actinic porokeratosis (DSAP): Secondary | ICD-10-CM | POA: Diagnosis not present

## 2021-05-19 DIAGNOSIS — N400 Enlarged prostate without lower urinary tract symptoms: Secondary | ICD-10-CM | POA: Diagnosis not present

## 2021-05-19 DIAGNOSIS — I10 Essential (primary) hypertension: Secondary | ICD-10-CM | POA: Diagnosis not present

## 2021-05-19 DIAGNOSIS — J449 Chronic obstructive pulmonary disease, unspecified: Secondary | ICD-10-CM | POA: Diagnosis not present

## 2021-05-19 DIAGNOSIS — E1169 Type 2 diabetes mellitus with other specified complication: Secondary | ICD-10-CM | POA: Diagnosis not present

## 2021-05-19 DIAGNOSIS — E7800 Pure hypercholesterolemia, unspecified: Secondary | ICD-10-CM | POA: Diagnosis not present

## 2021-05-19 DIAGNOSIS — G4700 Insomnia, unspecified: Secondary | ICD-10-CM | POA: Diagnosis not present

## 2021-05-20 DIAGNOSIS — H353133 Nonexudative age-related macular degeneration, bilateral, advanced atrophic without subfoveal involvement: Secondary | ICD-10-CM | POA: Diagnosis not present

## 2021-05-20 DIAGNOSIS — Z961 Presence of intraocular lens: Secondary | ICD-10-CM | POA: Diagnosis not present

## 2021-05-20 DIAGNOSIS — E119 Type 2 diabetes mellitus without complications: Secondary | ICD-10-CM | POA: Diagnosis not present

## 2021-06-08 DIAGNOSIS — I10 Essential (primary) hypertension: Secondary | ICD-10-CM | POA: Diagnosis not present

## 2021-06-08 DIAGNOSIS — E1169 Type 2 diabetes mellitus with other specified complication: Secondary | ICD-10-CM | POA: Diagnosis not present

## 2021-06-08 DIAGNOSIS — B0229 Other postherpetic nervous system involvement: Secondary | ICD-10-CM | POA: Diagnosis not present

## 2021-06-08 DIAGNOSIS — E7800 Pure hypercholesterolemia, unspecified: Secondary | ICD-10-CM | POA: Diagnosis not present

## 2021-06-20 DIAGNOSIS — N400 Enlarged prostate without lower urinary tract symptoms: Secondary | ICD-10-CM | POA: Diagnosis not present

## 2021-06-20 DIAGNOSIS — E1169 Type 2 diabetes mellitus with other specified complication: Secondary | ICD-10-CM | POA: Diagnosis not present

## 2021-06-20 DIAGNOSIS — G4700 Insomnia, unspecified: Secondary | ICD-10-CM | POA: Diagnosis not present

## 2021-06-20 DIAGNOSIS — E7800 Pure hypercholesterolemia, unspecified: Secondary | ICD-10-CM | POA: Diagnosis not present

## 2021-06-20 DIAGNOSIS — I10 Essential (primary) hypertension: Secondary | ICD-10-CM | POA: Diagnosis not present

## 2021-06-20 DIAGNOSIS — J449 Chronic obstructive pulmonary disease, unspecified: Secondary | ICD-10-CM | POA: Diagnosis not present

## 2021-06-25 DIAGNOSIS — L820 Inflamed seborrheic keratosis: Secondary | ICD-10-CM | POA: Diagnosis not present

## 2021-06-25 DIAGNOSIS — Z85820 Personal history of malignant melanoma of skin: Secondary | ICD-10-CM | POA: Diagnosis not present

## 2021-06-25 DIAGNOSIS — L821 Other seborrheic keratosis: Secondary | ICD-10-CM | POA: Diagnosis not present

## 2021-06-25 DIAGNOSIS — Z85828 Personal history of other malignant neoplasm of skin: Secondary | ICD-10-CM | POA: Diagnosis not present

## 2021-06-25 DIAGNOSIS — L570 Actinic keratosis: Secondary | ICD-10-CM | POA: Diagnosis not present

## 2021-07-29 DIAGNOSIS — B0229 Other postherpetic nervous system involvement: Secondary | ICD-10-CM | POA: Diagnosis not present

## 2021-07-29 DIAGNOSIS — H61899 Other specified disorders of external ear, unspecified ear: Secondary | ICD-10-CM | POA: Diagnosis not present

## 2021-08-04 DIAGNOSIS — L570 Actinic keratosis: Secondary | ICD-10-CM | POA: Diagnosis not present

## 2021-08-04 DIAGNOSIS — Z85828 Personal history of other malignant neoplasm of skin: Secondary | ICD-10-CM | POA: Diagnosis not present

## 2021-08-04 DIAGNOSIS — Z85820 Personal history of malignant melanoma of skin: Secondary | ICD-10-CM | POA: Diagnosis not present

## 2021-08-04 DIAGNOSIS — D485 Neoplasm of uncertain behavior of skin: Secondary | ICD-10-CM | POA: Diagnosis not present

## 2021-10-05 DIAGNOSIS — N4889 Other specified disorders of penis: Secondary | ICD-10-CM | POA: Diagnosis not present

## 2021-10-26 DIAGNOSIS — Z85820 Personal history of malignant melanoma of skin: Secondary | ICD-10-CM | POA: Diagnosis not present

## 2021-10-26 DIAGNOSIS — L570 Actinic keratosis: Secondary | ICD-10-CM | POA: Diagnosis not present

## 2021-10-26 DIAGNOSIS — L821 Other seborrheic keratosis: Secondary | ICD-10-CM | POA: Diagnosis not present

## 2021-10-26 DIAGNOSIS — Z85828 Personal history of other malignant neoplasm of skin: Secondary | ICD-10-CM | POA: Diagnosis not present

## 2021-11-06 ENCOUNTER — Other Ambulatory Visit: Payer: Self-pay | Admitting: Otolaryngology

## 2021-11-06 DIAGNOSIS — D37030 Neoplasm of uncertain behavior of the parotid salivary glands: Secondary | ICD-10-CM | POA: Diagnosis not present

## 2021-11-06 DIAGNOSIS — K118 Other diseases of salivary glands: Secondary | ICD-10-CM

## 2021-11-18 ENCOUNTER — Ambulatory Visit
Admission: RE | Admit: 2021-11-18 | Discharge: 2021-11-18 | Disposition: A | Discharge status: Home / Self Care | Payer: PPO | Source: Ambulatory Visit | Attending: Otolaryngology | Admitting: Otolaryngology

## 2021-11-18 DIAGNOSIS — M4802 Spinal stenosis, cervical region: Secondary | ICD-10-CM | POA: Diagnosis not present

## 2021-11-18 DIAGNOSIS — M4322 Fusion of spine, cervical region: Secondary | ICD-10-CM | POA: Diagnosis not present

## 2021-11-18 DIAGNOSIS — K118 Other diseases of salivary glands: Secondary | ICD-10-CM | POA: Diagnosis not present

## 2021-11-18 DIAGNOSIS — M47812 Spondylosis without myelopathy or radiculopathy, cervical region: Secondary | ICD-10-CM | POA: Diagnosis not present

## 2021-11-18 MED ORDER — IOPAMIDOL (ISOVUE-300) INJECTION 61%
75.0000 mL | Freq: Once | INTRAVENOUS | Status: AC | PRN
  Administered 2021-11-18: 75 mL via INTRAVENOUS

## 2021-11-26 DIAGNOSIS — Z7984 Long term (current) use of oral hypoglycemic drugs: Secondary | ICD-10-CM | POA: Diagnosis not present

## 2021-11-26 DIAGNOSIS — B3749 Other urogenital candidiasis: Secondary | ICD-10-CM | POA: Diagnosis not present

## 2021-11-26 DIAGNOSIS — E1169 Type 2 diabetes mellitus with other specified complication: Secondary | ICD-10-CM | POA: Diagnosis not present

## 2021-12-14 DIAGNOSIS — N529 Male erectile dysfunction, unspecified: Secondary | ICD-10-CM | POA: Diagnosis not present

## 2021-12-14 DIAGNOSIS — N4829 Other inflammatory disorders of penis: Secondary | ICD-10-CM | POA: Diagnosis not present

## 2021-12-23 DIAGNOSIS — N489 Disorder of penis, unspecified: Secondary | ICD-10-CM | POA: Diagnosis not present

## 2021-12-28 DIAGNOSIS — K119 Disease of salivary gland, unspecified: Secondary | ICD-10-CM | POA: Diagnosis not present

## 2022-01-05 DIAGNOSIS — D110 Benign neoplasm of parotid gland: Secondary | ICD-10-CM | POA: Diagnosis not present

## 2022-01-11 DIAGNOSIS — J449 Chronic obstructive pulmonary disease, unspecified: Secondary | ICD-10-CM | POA: Diagnosis not present

## 2022-01-11 DIAGNOSIS — N400 Enlarged prostate without lower urinary tract symptoms: Secondary | ICD-10-CM | POA: Diagnosis not present

## 2022-01-11 DIAGNOSIS — G4700 Insomnia, unspecified: Secondary | ICD-10-CM | POA: Diagnosis not present

## 2022-01-11 DIAGNOSIS — F1021 Alcohol dependence, in remission: Secondary | ICD-10-CM | POA: Diagnosis not present

## 2022-01-11 DIAGNOSIS — E1169 Type 2 diabetes mellitus with other specified complication: Secondary | ICD-10-CM | POA: Diagnosis not present

## 2022-01-11 DIAGNOSIS — E7800 Pure hypercholesterolemia, unspecified: Secondary | ICD-10-CM | POA: Diagnosis not present

## 2022-01-11 DIAGNOSIS — I10 Essential (primary) hypertension: Secondary | ICD-10-CM | POA: Diagnosis not present

## 2022-01-11 DIAGNOSIS — Z0000 Encounter for general adult medical examination without abnormal findings: Secondary | ICD-10-CM | POA: Diagnosis not present

## 2022-01-11 DIAGNOSIS — R1310 Dysphagia, unspecified: Secondary | ICD-10-CM | POA: Diagnosis not present

## 2022-01-11 DIAGNOSIS — B0229 Other postherpetic nervous system involvement: Secondary | ICD-10-CM | POA: Diagnosis not present

## 2022-01-11 DIAGNOSIS — I679 Cerebrovascular disease, unspecified: Secondary | ICD-10-CM | POA: Diagnosis not present

## 2022-01-19 DIAGNOSIS — Z0000 Encounter for general adult medical examination without abnormal findings: Secondary | ICD-10-CM | POA: Diagnosis not present

## 2022-01-19 DIAGNOSIS — E7800 Pure hypercholesterolemia, unspecified: Secondary | ICD-10-CM | POA: Diagnosis not present

## 2022-01-19 DIAGNOSIS — E1169 Type 2 diabetes mellitus with other specified complication: Secondary | ICD-10-CM | POA: Diagnosis not present

## 2022-01-19 DIAGNOSIS — I10 Essential (primary) hypertension: Secondary | ICD-10-CM | POA: Diagnosis not present

## 2022-02-01 DIAGNOSIS — D110 Benign neoplasm of parotid gland: Secondary | ICD-10-CM | POA: Diagnosis not present

## 2022-03-09 DIAGNOSIS — I10 Essential (primary) hypertension: Secondary | ICD-10-CM | POA: Diagnosis not present

## 2022-03-09 DIAGNOSIS — E7800 Pure hypercholesterolemia, unspecified: Secondary | ICD-10-CM | POA: Diagnosis not present

## 2022-03-09 DIAGNOSIS — J449 Chronic obstructive pulmonary disease, unspecified: Secondary | ICD-10-CM | POA: Diagnosis not present

## 2022-03-09 DIAGNOSIS — N400 Enlarged prostate without lower urinary tract symptoms: Secondary | ICD-10-CM | POA: Diagnosis not present

## 2022-03-09 DIAGNOSIS — G4700 Insomnia, unspecified: Secondary | ICD-10-CM | POA: Diagnosis not present

## 2022-03-09 DIAGNOSIS — E1169 Type 2 diabetes mellitus with other specified complication: Secondary | ICD-10-CM | POA: Diagnosis not present

## 2022-04-22 DIAGNOSIS — H04123 Dry eye syndrome of bilateral lacrimal glands: Secondary | ICD-10-CM | POA: Diagnosis not present

## 2022-04-29 DIAGNOSIS — L570 Actinic keratosis: Secondary | ICD-10-CM | POA: Diagnosis not present

## 2022-04-29 DIAGNOSIS — Z85820 Personal history of malignant melanoma of skin: Secondary | ICD-10-CM | POA: Diagnosis not present

## 2022-04-29 DIAGNOSIS — L821 Other seborrheic keratosis: Secondary | ICD-10-CM | POA: Diagnosis not present

## 2022-04-29 DIAGNOSIS — L812 Freckles: Secondary | ICD-10-CM | POA: Diagnosis not present

## 2022-04-29 DIAGNOSIS — Z85828 Personal history of other malignant neoplasm of skin: Secondary | ICD-10-CM | POA: Diagnosis not present

## 2022-04-29 DIAGNOSIS — D1801 Hemangioma of skin and subcutaneous tissue: Secondary | ICD-10-CM | POA: Diagnosis not present

## 2022-04-29 DIAGNOSIS — L281 Prurigo nodularis: Secondary | ICD-10-CM | POA: Diagnosis not present

## 2022-05-26 DIAGNOSIS — E119 Type 2 diabetes mellitus without complications: Secondary | ICD-10-CM | POA: Diagnosis not present

## 2022-05-26 DIAGNOSIS — H353133 Nonexudative age-related macular degeneration, bilateral, advanced atrophic without subfoveal involvement: Secondary | ICD-10-CM | POA: Diagnosis not present

## 2022-05-26 DIAGNOSIS — Z961 Presence of intraocular lens: Secondary | ICD-10-CM | POA: Diagnosis not present

## 2022-05-26 DIAGNOSIS — H04123 Dry eye syndrome of bilateral lacrimal glands: Secondary | ICD-10-CM | POA: Diagnosis not present

## 2022-07-19 DIAGNOSIS — Z6828 Body mass index (BMI) 28.0-28.9, adult: Secondary | ICD-10-CM | POA: Diagnosis not present

## 2022-07-19 DIAGNOSIS — E1169 Type 2 diabetes mellitus with other specified complication: Secondary | ICD-10-CM | POA: Diagnosis not present

## 2022-07-19 DIAGNOSIS — Z23 Encounter for immunization: Secondary | ICD-10-CM | POA: Diagnosis not present

## 2022-07-19 DIAGNOSIS — B0229 Other postherpetic nervous system involvement: Secondary | ICD-10-CM | POA: Diagnosis not present

## 2022-07-19 DIAGNOSIS — R202 Paresthesia of skin: Secondary | ICD-10-CM | POA: Diagnosis not present

## 2022-07-19 DIAGNOSIS — J449 Chronic obstructive pulmonary disease, unspecified: Secondary | ICD-10-CM | POA: Diagnosis not present

## 2022-07-19 DIAGNOSIS — F1021 Alcohol dependence, in remission: Secondary | ICD-10-CM | POA: Diagnosis not present

## 2022-07-26 DIAGNOSIS — E1169 Type 2 diabetes mellitus with other specified complication: Secondary | ICD-10-CM | POA: Diagnosis not present

## 2022-08-17 ENCOUNTER — Ambulatory Visit (INDEPENDENT_AMBULATORY_CARE_PROVIDER_SITE_OTHER): Payer: PPO | Admitting: Podiatry

## 2022-08-17 DIAGNOSIS — L600 Ingrowing nail: Secondary | ICD-10-CM

## 2022-08-17 MED ORDER — CEPHALEXIN 500 MG PO CAPS: 500.0000 mg | ORAL_CAPSULE | Freq: Three times a day (TID) | ORAL | 0 refills | Status: DC

## 2022-08-17 MED ORDER — CEPHALEXIN 500 MG PO CAPS: 500.0000 mg | ORAL_CAPSULE | Freq: Three times a day (TID) | ORAL | 0 refills | Status: AC

## 2022-08-17 NOTE — Progress Notes (Signed)
  Subjective:  Patient ID: Billy [name], male    DOB: 02/24/1946,  MRN: [id number]  Chief Complaint  Patient presents with   Nail Problem     Right great toe infected. diabetic    77 y.o. male presents with the above complaint. History confirmed with patient.  He tried to cut himself and thinks he may have caused more problems  Objective:  Physical Exam: warm, good capillary refill, no trophic changes or ulcerative lesions, normal DP and PT pulses, normal sensory exam, and ingrown right great toenail lateral border with erythema.  Assessment:   1. Ingrown toenail      Plan:  Patient was evaluated and treated and all questions answered.    Ingrown Nail, right -Patient elects to proceed with minor surgery to remove ingrown toenail today. Consent reviewed and signed by patient. -Ingrown nail excised. See procedure note. -Educated on post-procedure care including soaking. Written instructions provided and reviewed. -Rx for cephalexin sent to pharmacy Return 2 weeks for nail check  Procedure: Excision of Ingrown Toenail Location: Right 1st toe lateral nail borders. Anesthesia: Lidocaine 1% plain; 1.5 mL and Marcaine 0.5% plain; 1.5 mL, digital block. Skin Prep: Betadine. Dressing: Silvadene; telfa; dry, sterile, compression dressing. Technique: Following skin prep, the toe was exsanguinated and a tourniquet was secured at the base of the toe. The affected nail border was freed, split with a nail splitter, and excised. Chemical matrixectomy was not performed. The tourniquet was then removed and sterile dressing applied. Disposition: Patient tolerated procedure well.    Return in about 2 weeks (around 08/31/2022) for nail re-check.

## 2022-08-17 NOTE — Patient Instructions (Signed)

## 2022-09-01 ENCOUNTER — Ambulatory Visit (INDEPENDENT_AMBULATORY_CARE_PROVIDER_SITE_OTHER): Payer: PPO | Admitting: Podiatry

## 2022-09-01 DIAGNOSIS — L600 Ingrowing nail: Secondary | ICD-10-CM

## 2022-09-01 NOTE — Progress Notes (Signed)
  Subjective:  Patient ID: Billy [name], male    DOB: 07/31/1945,  MRN: [id number]  Chief Complaint  Patient presents with   Ingrown Toenail    2wk nail check    77 y.o. male presents with the above complaint. History confirmed with patient.  He says doing well all feeling much better than he was 1  Objective:  Physical Exam: warm, good capillary refill, no trophic changes or ulcerative lesions, normal DP and PT pulses, normal sensory exam, and right hallux nail matricectomy site healing well  Assessment:   1. Ingrown toenail      Plan:  Patient was evaluated and treated and all questions answered.    Ingrown Nail, right -Matricectomy site is healing well can leave open to air discontinue soaks and ointments.  Remaining nails were trimmed at his request today.  He will return for at risk regular footcare as scheduled  Return if symptoms worsen or fail to improve.

## 2022-10-04 DIAGNOSIS — R1310 Dysphagia, unspecified: Secondary | ICD-10-CM | POA: Diagnosis not present

## 2022-10-04 DIAGNOSIS — Z6828 Body mass index (BMI) 28.0-28.9, adult: Secondary | ICD-10-CM | POA: Diagnosis not present

## 2022-10-04 DIAGNOSIS — K219 Gastro-esophageal reflux disease without esophagitis: Secondary | ICD-10-CM | POA: Diagnosis not present

## 2022-10-20 DIAGNOSIS — Z6827 Body mass index (BMI) 27.0-27.9, adult: Secondary | ICD-10-CM | POA: Diagnosis not present

## 2022-10-20 DIAGNOSIS — R051 Acute cough: Secondary | ICD-10-CM | POA: Diagnosis not present

## 2022-10-20 DIAGNOSIS — Z03818 Encounter for observation for suspected exposure to other biological agents ruled out: Secondary | ICD-10-CM | POA: Diagnosis not present

## 2022-10-20 DIAGNOSIS — J069 Acute upper respiratory infection, unspecified: Secondary | ICD-10-CM | POA: Diagnosis not present

## 2022-11-01 DIAGNOSIS — Z85828 Personal history of other malignant neoplasm of skin: Secondary | ICD-10-CM | POA: Diagnosis not present

## 2022-11-01 DIAGNOSIS — L821 Other seborrheic keratosis: Secondary | ICD-10-CM | POA: Diagnosis not present

## 2022-11-01 DIAGNOSIS — L281 Prurigo nodularis: Secondary | ICD-10-CM | POA: Diagnosis not present

## 2022-11-01 DIAGNOSIS — L812 Freckles: Secondary | ICD-10-CM | POA: Diagnosis not present

## 2022-11-01 DIAGNOSIS — D4819 Other specified neoplasm of uncertain behavior of connective and other soft tissue: Secondary | ICD-10-CM | POA: Diagnosis not present

## 2022-11-01 DIAGNOSIS — C44729 Squamous cell carcinoma of skin of left lower limb, including hip: Secondary | ICD-10-CM | POA: Diagnosis not present

## 2022-11-01 DIAGNOSIS — Z85820 Personal history of malignant melanoma of skin: Secondary | ICD-10-CM | POA: Diagnosis not present

## 2022-11-01 DIAGNOSIS — C44722 Squamous cell carcinoma of skin of right lower limb, including hip: Secondary | ICD-10-CM | POA: Diagnosis not present

## 2022-11-01 DIAGNOSIS — D1801 Hemangioma of skin and subcutaneous tissue: Secondary | ICD-10-CM | POA: Diagnosis not present

## 2022-11-01 DIAGNOSIS — L565 Disseminated superficial actinic porokeratosis (DSAP): Secondary | ICD-10-CM | POA: Diagnosis not present

## 2022-11-01 DIAGNOSIS — D485 Neoplasm of uncertain behavior of skin: Secondary | ICD-10-CM | POA: Diagnosis not present

## 2022-11-01 DIAGNOSIS — D225 Melanocytic nevi of trunk: Secondary | ICD-10-CM | POA: Diagnosis not present

## 2022-11-01 DIAGNOSIS — L570 Actinic keratosis: Secondary | ICD-10-CM | POA: Diagnosis not present

## 2022-11-17 ENCOUNTER — Encounter: Payer: Self-pay | Admitting: Podiatry

## 2022-11-17 ENCOUNTER — Ambulatory Visit (INDEPENDENT_AMBULATORY_CARE_PROVIDER_SITE_OTHER): Payer: PPO | Admitting: Podiatry

## 2022-11-17 DIAGNOSIS — E119 Type 2 diabetes mellitus without complications: Secondary | ICD-10-CM

## 2022-11-17 DIAGNOSIS — L603 Nail dystrophy: Secondary | ICD-10-CM

## 2022-11-17 NOTE — Progress Notes (Signed)
.  This patient returns to my office for at risk foot care.  This patient requires this care by a professional since this patient will be at risk due to having diabetes. He has been treated for infection right big toe.  This patient presents for at risk foot care today.  General Appearance  Alert, conversant and in no acute stress.  Vascular  Dorsalis pedis and posterior tibial  pulses are palpable  bilaterally.  Capillary return is within normal limits  bilaterally. Temperature is within normal limits  bilaterally.  Neurologic  Senn-Weinstein monofilament wire test within normal limits  bilaterally. Muscle power within normal limits bilaterally.  Nails Nail dystrophy right hallux.No evidence of bacterial infection or drainage bilaterally.  Orthopedic  No limitations of motion  feet .  No crepitus or effusions noted.  No bony pathology or digital deformities noted. DJD 1st MPJ  B/L.  Skin  normotropic skin with no porokeratosis noted bilaterally.  No signs of infections or ulcers noted.     Onychomycosis  Pain in right toes  Pain in left toes  Consent was obtained for treatment procedures.   Mechanical debridement of nails 1-5  bilaterally performed with a nail nipper.  Filed with dremel without incident.    Return office visit    3 months                  Told patient to return for periodic foot care and evaluation due to potential at risk complications.   Helane Gunther DPM

## 2023-01-18 DIAGNOSIS — Z6828 Body mass index (BMI) 28.0-28.9, adult: Secondary | ICD-10-CM | POA: Diagnosis not present

## 2023-01-18 DIAGNOSIS — E7800 Pure hypercholesterolemia, unspecified: Secondary | ICD-10-CM | POA: Diagnosis not present

## 2023-01-18 DIAGNOSIS — Z0000 Encounter for general adult medical examination without abnormal findings: Secondary | ICD-10-CM | POA: Diagnosis not present

## 2023-01-18 DIAGNOSIS — G4700 Insomnia, unspecified: Secondary | ICD-10-CM | POA: Diagnosis not present

## 2023-01-18 DIAGNOSIS — N400 Enlarged prostate without lower urinary tract symptoms: Secondary | ICD-10-CM | POA: Diagnosis not present

## 2023-01-18 DIAGNOSIS — I10 Essential (primary) hypertension: Secondary | ICD-10-CM | POA: Diagnosis not present

## 2023-01-18 DIAGNOSIS — J449 Chronic obstructive pulmonary disease, unspecified: Secondary | ICD-10-CM | POA: Diagnosis not present

## 2023-01-18 DIAGNOSIS — I679 Cerebrovascular disease, unspecified: Secondary | ICD-10-CM | POA: Diagnosis not present

## 2023-01-18 DIAGNOSIS — E119 Type 2 diabetes mellitus without complications: Secondary | ICD-10-CM | POA: Diagnosis not present

## 2023-01-18 DIAGNOSIS — K219 Gastro-esophageal reflux disease without esophagitis: Secondary | ICD-10-CM | POA: Diagnosis not present

## 2023-01-18 DIAGNOSIS — B0229 Other postherpetic nervous system involvement: Secondary | ICD-10-CM | POA: Diagnosis not present

## 2023-01-28 DIAGNOSIS — Z0000 Encounter for general adult medical examination without abnormal findings: Secondary | ICD-10-CM | POA: Diagnosis not present

## 2023-01-28 DIAGNOSIS — E663 Overweight: Secondary | ICD-10-CM | POA: Diagnosis not present

## 2023-02-16 ENCOUNTER — Other Ambulatory Visit: Payer: Self-pay

## 2023-02-16 ENCOUNTER — Other Ambulatory Visit (HOSPITAL_COMMUNITY): Payer: Self-pay

## 2023-02-17 ENCOUNTER — Other Ambulatory Visit: Payer: Self-pay

## 2023-02-17 ENCOUNTER — Other Ambulatory Visit (HOSPITAL_COMMUNITY): Payer: Self-pay

## 2023-02-17 ENCOUNTER — Ambulatory Visit: Payer: PPO | Admitting: Podiatry

## 2023-02-17 MED ORDER — CLOPIDOGREL BISULFATE 75 MG PO TABS
75.0000 mg | ORAL_TABLET | Freq: Every day | ORAL | 4 refills | Status: DC
  Filled 2023-02-17: qty 90, 90d supply, fill #0
  Filled 2023-05-09 – 2024-02-14 (×3): qty 90, 90d supply, fill #1

## 2023-02-17 MED ORDER — GABAPENTIN 600 MG PO TABS
600.0000 mg | ORAL_TABLET | Freq: Two times a day (BID) | ORAL | 4 refills | Status: AC
  Filled 2023-02-17: qty 180, 90d supply, fill #0
  Filled 2023-05-09 – 2023-05-16 (×4): qty 180, 90d supply, fill #1

## 2023-02-17 MED ORDER — FREESTYLE LITE W/DEVICE KIT: PACK | 0 refills | Status: AC

## 2023-02-17 MED ORDER — TRAZODONE HCL 50 MG PO TABS: 50.0000 mg | ORAL_TABLET | Freq: Every day | ORAL | 3 refills | Status: AC

## 2023-02-17 MED ORDER — TAMSULOSIN HCL 0.4 MG PO CAPS
0.8000 mg | ORAL_CAPSULE | Freq: Every day | ORAL | 4 refills | Status: AC
  Filled 2023-02-17: qty 180, 90d supply, fill #0
  Filled 2023-05-09 – 2023-05-16 (×3): qty 180, 90d supply, fill #1

## 2023-02-17 MED ORDER — FREESTYLE LITE TEST VI STRP: ORAL_STRIP | 3 refills | Status: AC

## 2023-02-17 MED ORDER — GABAPENTIN 600 MG PO TABS: 600.0000 mg | ORAL_TABLET | Freq: Two times a day (BID) | ORAL | 4 refills | Status: AC

## 2023-02-17 MED ORDER — METFORMIN HCL ER 500 MG PO TB24
1000.0000 mg | ORAL_TABLET | Freq: Two times a day (BID) | ORAL | 4 refills | Status: AC
  Filled 2024-01-05 – 2024-01-06 (×3): qty 360, 90d supply, fill #0

## 2023-02-17 MED ORDER — METFORMIN HCL ER 500 MG PO TB24
1000.0000 mg | ORAL_TABLET | Freq: Two times a day (BID) | ORAL | 4 refills | Status: AC
  Filled 2023-02-17: qty 360, 90d supply, fill #0
  Filled 2023-05-09 – 2023-05-16 (×3): qty 360, 90d supply, fill #1
  Filled 2023-10-07: qty 360, 90d supply, fill #2

## 2023-02-17 MED ORDER — FAMOTIDINE 40 MG PO TABS: 40.0000 mg | ORAL_TABLET | Freq: Every day | ORAL | 4 refills | Status: AC

## 2023-02-17 MED ORDER — ATORVASTATIN CALCIUM 40 MG PO TABS
40.0000 mg | ORAL_TABLET | Freq: Every day | ORAL | 4 refills | Status: DC
Start: 2023-01-18 — End: 2024-05-01
  Filled 2023-10-07: qty 90, 90d supply, fill #0
  Filled 2024-01-05 – 2024-01-06 (×3): qty 90, 90d supply, fill #1

## 2023-02-17 MED ORDER — PIOGLITAZONE HCL 15 MG PO TABS
15.0000 mg | ORAL_TABLET | Freq: Every day | ORAL | 4 refills | Status: DC
  Filled 2023-02-17: qty 90, 90d supply, fill #0
  Filled 2023-05-09 – 2023-05-16 (×4): qty 90, 90d supply, fill #1
  Filled 2023-10-07: qty 90, 90d supply, fill #2

## 2023-02-17 MED ORDER — GLIPIZIDE ER 10 MG PO TB24
10.0000 mg | ORAL_TABLET | Freq: Every day | ORAL | 4 refills | Status: DC
  Filled 2023-02-17: qty 90, 90d supply, fill #0
  Filled 2023-05-09 – 2023-05-16 (×3): qty 90, 90d supply, fill #1

## 2023-02-17 MED ORDER — TRIAMCINOLONE ACETONIDE 0.1 % EX CREA: 1.0000 | TOPICAL_CREAM | Freq: Two times a day (BID) | CUTANEOUS | 0 refills | Status: AC | PRN

## 2023-02-17 MED ORDER — PIOGLITAZONE HCL 15 MG PO TABS
15.0000 mg | ORAL_TABLET | Freq: Every day | ORAL | 5 refills | Status: AC
  Filled 2024-01-05 – 2024-01-06 (×3): qty 30, 30d supply, fill #0

## 2023-02-17 MED ORDER — FREESTYLE LANCETS MISC: 3 refills | Status: AC

## 2023-02-17 MED ORDER — TRAMADOL HCL 50 MG PO TABS: ORAL_TABLET | ORAL | 1 refills | Status: AC

## 2023-02-17 MED ORDER — GLIPIZIDE ER 10 MG PO TB24: 10.0000 mg | ORAL_TABLET | Freq: Every day | ORAL | 1 refills | Status: DC

## 2023-02-17 MED ORDER — ATORVASTATIN CALCIUM 40 MG PO TABS
40.0000 mg | ORAL_TABLET | Freq: Every day | ORAL | 4 refills | Status: AC
  Filled 2023-02-17: qty 90, 90d supply, fill #0
  Filled 2023-05-09 – 2023-05-16 (×4): qty 90, 90d supply, fill #1

## 2023-02-17 MED ORDER — TRAZODONE HCL 50 MG PO TABS
50.0000 mg | ORAL_TABLET | Freq: Every evening | ORAL | 3 refills | Status: DC | PRN
  Filled 2023-02-17: qty 90, 90d supply, fill #0
  Filled 2023-05-09 – 2023-05-16 (×4): qty 90, 90d supply, fill #1

## 2023-02-17 MED ORDER — FREESTYLE LIBRE 2 SENSOR MISC
3 refills | Status: AC
Start: 2022-03-24 — End: ?

## 2023-02-17 MED ORDER — AMLODIPINE BESYLATE 5 MG PO TABS
5.0000 mg | ORAL_TABLET | Freq: Every day | ORAL | 4 refills | Status: DC
Start: 2023-02-17 — End: 2024-04-10
  Filled 2023-02-17: qty 90, 90d supply, fill #0
  Filled 2023-05-09 – 2023-05-16 (×4): qty 90, 90d supply, fill #1
  Filled 2024-01-05 – 2024-01-06 (×3): qty 90, 90d supply, fill #2

## 2023-02-17 MED ORDER — CLOPIDOGREL BISULFATE 75 MG PO TABS
75.0000 mg | ORAL_TABLET | Freq: Every day | ORAL | 4 refills | Status: AC
  Filled 2023-05-09 – 2023-05-16 (×3): qty 90, 90d supply, fill #0
  Filled 2023-10-07: qty 30, 30d supply, fill #1
  Filled 2023-11-12 – 2023-11-15 (×2): qty 30, 30d supply, fill #2
  Filled 2023-12-14: qty 30, 30d supply, fill #3
  Filled 2024-01-05 – 2024-01-06 (×3): qty 30, 30d supply, fill #4

## 2023-02-17 MED ORDER — AMLODIPINE BESYLATE 5 MG PO TABS
5.0000 mg | ORAL_TABLET | Freq: Every day | ORAL | 4 refills | Status: AC
  Filled 2023-10-07: qty 90, 90d supply, fill #0

## 2023-02-17 MED ORDER — TAMSULOSIN HCL 0.4 MG PO CAPS
0.8000 mg | ORAL_CAPSULE | Freq: Every day | ORAL | 4 refills | Status: AC
  Filled 2023-05-09 – 2023-10-07 (×2): qty 180, 90d supply, fill #0
  Filled 2024-01-05 – 2024-01-06 (×3): qty 180, 90d supply, fill #1

## 2023-02-17 MED ORDER — TRAMADOL HCL 50 MG PO TABS
50.0000 mg | ORAL_TABLET | Freq: Three times a day (TID) | ORAL | 1 refills | Status: DC | PRN
  Filled 2023-02-17: qty 270, 90d supply, fill #0
  Filled 2023-05-09 – 2023-05-13 (×3): qty 270, 90d supply, fill #1

## 2023-02-18 ENCOUNTER — Other Ambulatory Visit: Payer: Self-pay

## 2023-02-18 ENCOUNTER — Other Ambulatory Visit (HOSPITAL_COMMUNITY): Payer: Self-pay

## 2023-02-22 ENCOUNTER — Other Ambulatory Visit (HOSPITAL_COMMUNITY): Payer: Self-pay

## 2023-03-14 DIAGNOSIS — R1319 Other dysphagia: Secondary | ICD-10-CM | POA: Diagnosis not present

## 2023-03-14 DIAGNOSIS — Z8673 Personal history of transient ischemic attack (TIA), and cerebral infarction without residual deficits: Secondary | ICD-10-CM | POA: Diagnosis not present

## 2023-03-14 DIAGNOSIS — R143 Flatulence: Secondary | ICD-10-CM | POA: Diagnosis not present

## 2023-03-14 DIAGNOSIS — Z1211 Encounter for screening for malignant neoplasm of colon: Secondary | ICD-10-CM | POA: Diagnosis not present

## 2023-03-28 ENCOUNTER — Other Ambulatory Visit: Payer: Self-pay

## 2023-03-28 ENCOUNTER — Other Ambulatory Visit (HOSPITAL_COMMUNITY): Payer: Self-pay

## 2023-03-28 DIAGNOSIS — Z23 Encounter for immunization: Secondary | ICD-10-CM | POA: Diagnosis not present

## 2023-03-28 DIAGNOSIS — R5381 Other malaise: Secondary | ICD-10-CM | POA: Diagnosis not present

## 2023-03-28 DIAGNOSIS — E1169 Type 2 diabetes mellitus with other specified complication: Secondary | ICD-10-CM | POA: Diagnosis not present

## 2023-03-28 DIAGNOSIS — Z79899 Other long term (current) drug therapy: Secondary | ICD-10-CM | POA: Diagnosis not present

## 2023-03-28 DIAGNOSIS — E119 Type 2 diabetes mellitus without complications: Secondary | ICD-10-CM | POA: Diagnosis not present

## 2023-03-28 DIAGNOSIS — E7800 Pure hypercholesterolemia, unspecified: Secondary | ICD-10-CM | POA: Diagnosis not present

## 2023-03-28 MED ORDER — METFORMIN HCL ER 500 MG PO TB24
500.0000 mg | ORAL_TABLET | Freq: Two times a day (BID) | ORAL | 4 refills | Status: AC
  Filled 2023-05-09: qty 180, 90d supply, fill #0

## 2023-03-28 MED ORDER — REPAGLINIDE 1 MG PO TABS
1.0000 mg | ORAL_TABLET | Freq: Two times a day (BID) | ORAL | 6 refills | Status: DC
  Filled 2023-03-28: qty 60, 30d supply, fill #0

## 2023-04-13 DIAGNOSIS — K2930 Chronic superficial gastritis without bleeding: Secondary | ICD-10-CM | POA: Diagnosis not present

## 2023-04-13 DIAGNOSIS — K3189 Other diseases of stomach and duodenum: Secondary | ICD-10-CM | POA: Diagnosis not present

## 2023-04-13 DIAGNOSIS — K2100 Gastro-esophageal reflux disease with esophagitis, without bleeding: Secondary | ICD-10-CM | POA: Diagnosis not present

## 2023-04-13 DIAGNOSIS — K317 Polyp of stomach and duodenum: Secondary | ICD-10-CM | POA: Diagnosis not present

## 2023-04-13 DIAGNOSIS — R1310 Dysphagia, unspecified: Secondary | ICD-10-CM | POA: Diagnosis not present

## 2023-04-13 DIAGNOSIS — K449 Diaphragmatic hernia without obstruction or gangrene: Secondary | ICD-10-CM | POA: Diagnosis not present

## 2023-04-13 DIAGNOSIS — K2210 Ulcer of esophagus without bleeding: Secondary | ICD-10-CM | POA: Diagnosis not present

## 2023-04-13 DIAGNOSIS — B3781 Candidal esophagitis: Secondary | ICD-10-CM | POA: Diagnosis not present

## 2023-04-13 DIAGNOSIS — K2289 Other specified disease of esophagus: Secondary | ICD-10-CM | POA: Diagnosis not present

## 2023-04-15 ENCOUNTER — Other Ambulatory Visit (HOSPITAL_COMMUNITY): Payer: Self-pay

## 2023-04-15 ENCOUNTER — Other Ambulatory Visit: Payer: Self-pay

## 2023-04-15 MED ORDER — SUCRALFATE 1 G PO TABS
1.0000 g | ORAL_TABLET | Freq: Two times a day (BID) | ORAL | 0 refills | Status: AC
  Filled 2023-04-15: qty 60, 30d supply, fill #0

## 2023-04-15 MED ORDER — PANTOPRAZOLE SODIUM 40 MG PO TBEC
40.0000 mg | DELAYED_RELEASE_TABLET | Freq: Two times a day (BID) | ORAL | 0 refills | Status: AC
  Filled 2023-04-15: qty 120, 60d supply, fill #0

## 2023-04-20 DIAGNOSIS — K2289 Other specified disease of esophagus: Secondary | ICD-10-CM | POA: Diagnosis not present

## 2023-04-20 DIAGNOSIS — K2930 Chronic superficial gastritis without bleeding: Secondary | ICD-10-CM | POA: Diagnosis not present

## 2023-04-20 DIAGNOSIS — K317 Polyp of stomach and duodenum: Secondary | ICD-10-CM | POA: Diagnosis not present

## 2023-04-20 DIAGNOSIS — B3781 Candidal esophagitis: Secondary | ICD-10-CM | POA: Diagnosis not present

## 2023-05-04 DIAGNOSIS — L570 Actinic keratosis: Secondary | ICD-10-CM | POA: Diagnosis not present

## 2023-05-04 DIAGNOSIS — L812 Freckles: Secondary | ICD-10-CM | POA: Diagnosis not present

## 2023-05-04 DIAGNOSIS — D1801 Hemangioma of skin and subcutaneous tissue: Secondary | ICD-10-CM | POA: Diagnosis not present

## 2023-05-04 DIAGNOSIS — D485 Neoplasm of uncertain behavior of skin: Secondary | ICD-10-CM | POA: Diagnosis not present

## 2023-05-04 DIAGNOSIS — Z85828 Personal history of other malignant neoplasm of skin: Secondary | ICD-10-CM | POA: Diagnosis not present

## 2023-05-04 DIAGNOSIS — L821 Other seborrheic keratosis: Secondary | ICD-10-CM | POA: Diagnosis not present

## 2023-05-04 DIAGNOSIS — Z85820 Personal history of malignant melanoma of skin: Secondary | ICD-10-CM | POA: Diagnosis not present

## 2023-05-04 DIAGNOSIS — D0471 Carcinoma in situ of skin of right lower limb, including hip: Secondary | ICD-10-CM | POA: Diagnosis not present

## 2023-05-09 ENCOUNTER — Other Ambulatory Visit (HOSPITAL_COMMUNITY): Payer: Self-pay

## 2023-05-09 ENCOUNTER — Other Ambulatory Visit: Payer: Self-pay

## 2023-05-09 MED ORDER — REPAGLINIDE 1 MG PO TABS
1.0000 mg | ORAL_TABLET | Freq: Two times a day (BID) | ORAL | 3 refills | Status: AC
  Filled 2023-05-09 – 2023-10-07 (×3): qty 180, 90d supply, fill #0
  Filled 2024-01-05 – 2024-01-06 (×3): qty 180, 90d supply, fill #1
  Filled 2024-04-27: qty 180, 90d supply, fill #2

## 2023-05-13 ENCOUNTER — Other Ambulatory Visit (HOSPITAL_COMMUNITY): Payer: Self-pay

## 2023-05-16 ENCOUNTER — Other Ambulatory Visit (HOSPITAL_COMMUNITY): Payer: Self-pay

## 2023-05-16 ENCOUNTER — Other Ambulatory Visit: Payer: Self-pay

## 2023-05-18 ENCOUNTER — Other Ambulatory Visit (HOSPITAL_COMMUNITY): Payer: Self-pay

## 2023-05-20 ENCOUNTER — Other Ambulatory Visit (HOSPITAL_COMMUNITY): Payer: Self-pay

## 2023-05-23 ENCOUNTER — Other Ambulatory Visit (HOSPITAL_COMMUNITY): Payer: Self-pay

## 2023-05-23 MED ORDER — REPAGLINIDE 1 MG PO TABS
1.0000 mg | ORAL_TABLET | Freq: Two times a day (BID) | ORAL | 3 refills | Status: AC
  Filled 2023-05-23: qty 180, 90d supply, fill #0

## 2023-05-25 ENCOUNTER — Other Ambulatory Visit (HOSPITAL_COMMUNITY): Payer: Self-pay

## 2023-07-28 ENCOUNTER — Other Ambulatory Visit (HOSPITAL_COMMUNITY): Payer: Self-pay

## 2023-07-28 ENCOUNTER — Other Ambulatory Visit: Payer: Self-pay

## 2023-07-28 MED ORDER — TRAMADOL HCL 50 MG PO TABS
50.0000 mg | ORAL_TABLET | ORAL | 1 refills | Status: AC | PRN
  Filled 2023-07-28 – 2023-10-07 (×2): qty 270, 90d supply, fill #0
  Filled 2024-01-05 – 2024-01-06 (×3): qty 270, 90d supply, fill #1

## 2023-07-28 MED ORDER — REPAGLINIDE 2 MG PO TABS
2.0000 mg | ORAL_TABLET | Freq: Two times a day (BID) | ORAL | 1 refills | Status: AC
  Filled 2023-07-28: qty 180, 90d supply, fill #0

## 2023-07-28 MED ORDER — PIOGLITAZONE HCL 30 MG PO TABS
30.0000 mg | ORAL_TABLET | Freq: Every day | ORAL | 1 refills | Status: AC
  Filled 2023-07-28: qty 90, 90d supply, fill #0
  Filled 2024-06-05: qty 90, 90d supply, fill #1

## 2023-10-07 ENCOUNTER — Other Ambulatory Visit: Payer: Self-pay

## 2023-10-07 ENCOUNTER — Other Ambulatory Visit (HOSPITAL_COMMUNITY): Payer: Self-pay

## 2023-10-10 DIAGNOSIS — H353124 Nonexudative age-related macular degeneration, left eye, advanced atrophic with subfoveal involvement: Secondary | ICD-10-CM | POA: Diagnosis not present

## 2023-10-10 DIAGNOSIS — H35033 Hypertensive retinopathy, bilateral: Secondary | ICD-10-CM | POA: Diagnosis not present

## 2023-10-10 DIAGNOSIS — E119 Type 2 diabetes mellitus without complications: Secondary | ICD-10-CM | POA: Diagnosis not present

## 2023-10-10 DIAGNOSIS — H43813 Vitreous degeneration, bilateral: Secondary | ICD-10-CM | POA: Diagnosis not present

## 2023-10-20 ENCOUNTER — Other Ambulatory Visit (HOSPITAL_COMMUNITY): Payer: Self-pay

## 2023-11-01 ENCOUNTER — Other Ambulatory Visit (HOSPITAL_COMMUNITY): Payer: Self-pay

## 2023-11-01 ENCOUNTER — Other Ambulatory Visit: Payer: Self-pay

## 2023-11-01 DIAGNOSIS — C44629 Squamous cell carcinoma of skin of left upper limb, including shoulder: Secondary | ICD-10-CM | POA: Diagnosis not present

## 2023-11-01 DIAGNOSIS — L821 Other seborrheic keratosis: Secondary | ICD-10-CM | POA: Diagnosis not present

## 2023-11-01 DIAGNOSIS — Z85820 Personal history of malignant melanoma of skin: Secondary | ICD-10-CM | POA: Diagnosis not present

## 2023-11-01 DIAGNOSIS — L570 Actinic keratosis: Secondary | ICD-10-CM | POA: Diagnosis not present

## 2023-11-01 DIAGNOSIS — D485 Neoplasm of uncertain behavior of skin: Secondary | ICD-10-CM | POA: Diagnosis not present

## 2023-11-01 DIAGNOSIS — Z85828 Personal history of other malignant neoplasm of skin: Secondary | ICD-10-CM | POA: Diagnosis not present

## 2023-11-01 MED ORDER — DESOXIMETASONE 0.25 % EX CREA
TOPICAL_CREAM | CUTANEOUS | 2 refills | Status: AC
  Filled 2023-11-01: qty 60, 30d supply, fill #0

## 2023-11-12 ENCOUNTER — Other Ambulatory Visit (HOSPITAL_COMMUNITY): Payer: Self-pay

## 2023-11-15 ENCOUNTER — Other Ambulatory Visit: Payer: Self-pay

## 2023-11-15 ENCOUNTER — Other Ambulatory Visit (HOSPITAL_COMMUNITY): Payer: Self-pay

## 2023-12-14 ENCOUNTER — Other Ambulatory Visit (HOSPITAL_COMMUNITY): Payer: Self-pay

## 2023-12-19 DIAGNOSIS — H35033 Hypertensive retinopathy, bilateral: Secondary | ICD-10-CM | POA: Diagnosis not present

## 2023-12-19 DIAGNOSIS — H353134 Nonexudative age-related macular degeneration, bilateral, advanced atrophic with subfoveal involvement: Secondary | ICD-10-CM | POA: Diagnosis not present

## 2023-12-19 DIAGNOSIS — H43813 Vitreous degeneration, bilateral: Secondary | ICD-10-CM | POA: Diagnosis not present

## 2023-12-30 ENCOUNTER — Other Ambulatory Visit (HOSPITAL_COMMUNITY): Payer: Self-pay

## 2023-12-30 ENCOUNTER — Other Ambulatory Visit: Payer: Self-pay

## 2023-12-30 DIAGNOSIS — G4700 Insomnia, unspecified: Secondary | ICD-10-CM | POA: Diagnosis not present

## 2023-12-30 DIAGNOSIS — Z6827 Body mass index (BMI) 27.0-27.9, adult: Secondary | ICD-10-CM | POA: Diagnosis not present

## 2023-12-30 DIAGNOSIS — M545 Low back pain, unspecified: Secondary | ICD-10-CM | POA: Diagnosis not present

## 2023-12-30 MED ORDER — DOXEPIN HCL 25 MG PO CAPS
25.0000 mg | ORAL_CAPSULE | Freq: Every evening | ORAL | 1 refills | Status: AC | PRN
  Filled 2023-12-30: qty 30, 30d supply, fill #0

## 2024-01-05 ENCOUNTER — Other Ambulatory Visit (HOSPITAL_COMMUNITY): Payer: Self-pay

## 2024-01-06 ENCOUNTER — Other Ambulatory Visit: Payer: Self-pay

## 2024-01-06 ENCOUNTER — Other Ambulatory Visit (HOSPITAL_COMMUNITY): Payer: Self-pay

## 2024-01-19 DIAGNOSIS — N400 Enlarged prostate without lower urinary tract symptoms: Secondary | ICD-10-CM | POA: Diagnosis not present

## 2024-01-19 DIAGNOSIS — E7800 Pure hypercholesterolemia, unspecified: Secondary | ICD-10-CM | POA: Diagnosis not present

## 2024-01-19 DIAGNOSIS — J449 Chronic obstructive pulmonary disease, unspecified: Secondary | ICD-10-CM | POA: Diagnosis not present

## 2024-01-19 DIAGNOSIS — E1169 Type 2 diabetes mellitus with other specified complication: Secondary | ICD-10-CM | POA: Diagnosis not present

## 2024-01-19 DIAGNOSIS — F1021 Alcohol dependence, in remission: Secondary | ICD-10-CM | POA: Diagnosis not present

## 2024-01-19 DIAGNOSIS — Z6827 Body mass index (BMI) 27.0-27.9, adult: Secondary | ICD-10-CM | POA: Diagnosis not present

## 2024-01-19 DIAGNOSIS — B0229 Other postherpetic nervous system involvement: Secondary | ICD-10-CM | POA: Diagnosis not present

## 2024-01-19 DIAGNOSIS — G4700 Insomnia, unspecified: Secondary | ICD-10-CM | POA: Diagnosis not present

## 2024-01-19 DIAGNOSIS — Z0000 Encounter for general adult medical examination without abnormal findings: Secondary | ICD-10-CM | POA: Diagnosis not present

## 2024-01-19 DIAGNOSIS — I10 Essential (primary) hypertension: Secondary | ICD-10-CM | POA: Diagnosis not present

## 2024-01-19 DIAGNOSIS — I679 Cerebrovascular disease, unspecified: Secondary | ICD-10-CM | POA: Diagnosis not present

## 2024-01-23 DIAGNOSIS — M5451 Vertebrogenic low back pain: Secondary | ICD-10-CM | POA: Diagnosis not present

## 2024-01-31 DIAGNOSIS — Z23 Encounter for immunization: Secondary | ICD-10-CM | POA: Diagnosis not present

## 2024-01-31 DIAGNOSIS — M5451 Vertebrogenic low back pain: Secondary | ICD-10-CM | POA: Diagnosis not present

## 2024-01-31 DIAGNOSIS — Z1331 Encounter for screening for depression: Secondary | ICD-10-CM | POA: Diagnosis not present

## 2024-01-31 DIAGNOSIS — Z0000 Encounter for general adult medical examination without abnormal findings: Secondary | ICD-10-CM | POA: Diagnosis not present

## 2024-02-13 DIAGNOSIS — H353124 Nonexudative age-related macular degeneration, left eye, advanced atrophic with subfoveal involvement: Secondary | ICD-10-CM | POA: Diagnosis not present

## 2024-02-14 ENCOUNTER — Other Ambulatory Visit: Payer: Self-pay

## 2024-02-14 ENCOUNTER — Other Ambulatory Visit (HOSPITAL_COMMUNITY): Payer: Self-pay

## 2024-03-01 ENCOUNTER — Other Ambulatory Visit (HOSPITAL_COMMUNITY): Payer: Self-pay

## 2024-03-02 ENCOUNTER — Other Ambulatory Visit (HOSPITAL_COMMUNITY): Payer: Self-pay

## 2024-03-05 ENCOUNTER — Other Ambulatory Visit (HOSPITAL_COMMUNITY): Payer: Self-pay

## 2024-03-05 ENCOUNTER — Other Ambulatory Visit (HOSPITAL_BASED_OUTPATIENT_CLINIC_OR_DEPARTMENT_OTHER): Payer: Self-pay

## 2024-03-06 ENCOUNTER — Other Ambulatory Visit: Payer: Self-pay

## 2024-03-06 ENCOUNTER — Other Ambulatory Visit (HOSPITAL_COMMUNITY): Payer: Self-pay

## 2024-03-06 MED ORDER — PIOGLITAZONE HCL 30 MG PO TABS
30.0000 mg | ORAL_TABLET | Freq: Every day | ORAL | 4 refills | Status: AC
  Filled 2024-03-06 (×2): qty 90, 90d supply, fill #0

## 2024-03-07 ENCOUNTER — Other Ambulatory Visit: Payer: Self-pay

## 2024-04-07 ENCOUNTER — Other Ambulatory Visit (HOSPITAL_COMMUNITY): Payer: Self-pay

## 2024-04-09 DIAGNOSIS — H01024 Squamous blepharitis left upper eyelid: Secondary | ICD-10-CM | POA: Diagnosis not present

## 2024-04-09 DIAGNOSIS — H43813 Vitreous degeneration, bilateral: Secondary | ICD-10-CM | POA: Diagnosis not present

## 2024-04-09 DIAGNOSIS — H353134 Nonexudative age-related macular degeneration, bilateral, advanced atrophic with subfoveal involvement: Secondary | ICD-10-CM | POA: Diagnosis not present

## 2024-04-09 DIAGNOSIS — H35033 Hypertensive retinopathy, bilateral: Secondary | ICD-10-CM | POA: Diagnosis not present

## 2024-04-09 DIAGNOSIS — H01022 Squamous blepharitis right lower eyelid: Secondary | ICD-10-CM | POA: Diagnosis not present

## 2024-04-10 ENCOUNTER — Other Ambulatory Visit (HOSPITAL_COMMUNITY): Payer: Self-pay

## 2024-04-10 ENCOUNTER — Other Ambulatory Visit: Payer: Self-pay

## 2024-04-10 MED ORDER — AMLODIPINE BESYLATE 5 MG PO TABS
5.0000 mg | ORAL_TABLET | Freq: Every day | ORAL | 1 refills | Status: AC
  Filled 2024-04-10: qty 90, 90d supply, fill #0
  Filled 2024-07-03: qty 90, 90d supply, fill #1

## 2024-04-11 ENCOUNTER — Other Ambulatory Visit (HOSPITAL_COMMUNITY): Payer: Self-pay

## 2024-04-12 ENCOUNTER — Other Ambulatory Visit: Payer: Self-pay

## 2024-04-27 ENCOUNTER — Other Ambulatory Visit (HOSPITAL_COMMUNITY): Payer: Self-pay

## 2024-04-30 ENCOUNTER — Other Ambulatory Visit (HOSPITAL_COMMUNITY): Payer: Self-pay

## 2024-04-30 ENCOUNTER — Other Ambulatory Visit: Payer: Self-pay

## 2024-04-30 MED ORDER — TRAZODONE HCL 50 MG PO TABS
50.0000 mg | ORAL_TABLET | Freq: Every evening | ORAL | 0 refills | Status: AC | PRN
Start: 2024-04-30 — End: ?
  Filled 2024-04-30: qty 180, 60d supply, fill #0

## 2024-04-30 MED ORDER — CLOPIDOGREL BISULFATE 75 MG PO TABS
75.0000 mg | ORAL_TABLET | Freq: Every day | ORAL | 1 refills | Status: AC
  Filled 2024-05-03: qty 90, 90d supply, fill #0

## 2024-05-01 ENCOUNTER — Other Ambulatory Visit (HOSPITAL_COMMUNITY): Payer: Self-pay

## 2024-05-01 MED ORDER — TAMSULOSIN HCL 0.4 MG PO CAPS
0.8000 mg | ORAL_CAPSULE | Freq: Every day | ORAL | 1 refills | Status: AC
  Filled 2024-05-01: qty 180, 90d supply, fill #0

## 2024-05-01 MED ORDER — ATORVASTATIN CALCIUM 40 MG PO TABS
40.0000 mg | ORAL_TABLET | Freq: Every day | ORAL | 3 refills | Status: AC
  Filled 2024-05-01: qty 90, 90d supply, fill #0

## 2024-05-01 MED ORDER — METFORMIN HCL ER 500 MG PO TB24
1000.0000 mg | ORAL_TABLET | Freq: Two times a day (BID) | ORAL | 1 refills | Status: AC
  Filled 2024-05-01: qty 360, 90d supply, fill #0

## 2024-05-03 ENCOUNTER — Other Ambulatory Visit (HOSPITAL_COMMUNITY): Payer: Self-pay

## 2024-05-03 DIAGNOSIS — Z85820 Personal history of malignant melanoma of skin: Secondary | ICD-10-CM | POA: Diagnosis not present

## 2024-05-03 DIAGNOSIS — L812 Freckles: Secondary | ICD-10-CM | POA: Diagnosis not present

## 2024-05-03 DIAGNOSIS — C4359 Malignant melanoma of other part of trunk: Secondary | ICD-10-CM | POA: Diagnosis not present

## 2024-05-03 DIAGNOSIS — D485 Neoplasm of uncertain behavior of skin: Secondary | ICD-10-CM | POA: Diagnosis not present

## 2024-05-03 DIAGNOSIS — L821 Other seborrheic keratosis: Secondary | ICD-10-CM | POA: Diagnosis not present

## 2024-05-03 DIAGNOSIS — Z85828 Personal history of other malignant neoplasm of skin: Secondary | ICD-10-CM | POA: Diagnosis not present

## 2024-05-03 DIAGNOSIS — L570 Actinic keratosis: Secondary | ICD-10-CM | POA: Diagnosis not present

## 2024-05-03 MED ORDER — TRAMADOL HCL 50 MG PO TABS: 50.0000 mg | ORAL_TABLET | Freq: Two times a day (BID) | ORAL | 0 refills | Status: AC

## 2024-05-04 ENCOUNTER — Other Ambulatory Visit (HOSPITAL_COMMUNITY): Payer: Self-pay

## 2024-05-23 ENCOUNTER — Encounter: Payer: Self-pay | Admitting: Podiatry

## 2024-05-23 ENCOUNTER — Ambulatory Visit: Admitting: Podiatry

## 2024-05-23 ENCOUNTER — Other Ambulatory Visit (HOSPITAL_COMMUNITY): Payer: Self-pay

## 2024-05-23 DIAGNOSIS — L600 Ingrowing nail: Secondary | ICD-10-CM | POA: Diagnosis not present

## 2024-05-23 MED ORDER — AMOXICILLIN-POT CLAVULANATE 875-125 MG PO TABS: 1.0000 | ORAL_TABLET | Freq: Two times a day (BID) | ORAL | 0 refills | Status: DC

## 2024-05-23 NOTE — Progress Notes (Signed)
 Patient complains of painful ingrown lateral border hallux nail right.  Tried cutting out the border himself and it started bleeding and getting red and painful.  Has had some clear drainage but no purulence noted.  Patient denies fevers, chills, nausea, vomiting.  Objective:  Vitals: Reviewed  General: Well developed, nourished, in no acute distress, alert and oriented x3   Vascular: DP pulse 2/4 bilateral. PT pulse 2/4 bilateral.  Mild to moderate edema toe with ingrown nail.  Capillary refill time immediate bilaterally  Dermatology: Erythema, edema, incurvated nail border lateral border hallux right with clear drainage and some granulation tissue.. Tenderness present with palpation. Normal skin tone and texture feet with normal hair growth.  Neurological: Grossly intact. Normal reflexes.   Musculoskeletal: Tenderness with palpation of the distal hallux right. No tenderness or painful ROM at IPJ.  Diagnosis: Ingrown nail lateral border hallux nail right  Plan: -discussed etiology and treatment of ingrown nails. Discussed surgical vs conservative treatment. -Consent signed for appropriate matrixectomy affected nail(s). -Rx: Augmentin 875 mg, 1 p.o. twice daily for 7 days  Procedure(s):   - Matrixectomy(s) lateral border hallux nail right: Toe anesthetized with 3cc 2:1 mixture 2% Lidocaine  with epinephrine: Sodium Bicarbonate. Surgical site prepped. Digital tourniquet applied.  Avulsion of nail border. performed. Matrixecomy performed with three 30 second applications of phenol to nail matrix. Site irrigated with alcohol.  Tourniquet released with good vascularity noticed in digit.  Applied triple antibiotic to nailbed and applied gauze and Coban dressing. - Written and oral postoperative instructions given.  -Return for post-op 2 weeks.  JINNY Prentice Binder, DPM

## 2024-05-23 NOTE — Patient Instructions (Signed)

## 2024-05-24 ENCOUNTER — Other Ambulatory Visit: Payer: Self-pay | Admitting: Lab

## 2024-05-24 DIAGNOSIS — Z85820 Personal history of malignant melanoma of skin: Secondary | ICD-10-CM | POA: Diagnosis not present

## 2024-05-24 DIAGNOSIS — C4359 Malignant melanoma of other part of trunk: Secondary | ICD-10-CM | POA: Diagnosis not present

## 2024-05-24 DIAGNOSIS — Z85828 Personal history of other malignant neoplasm of skin: Secondary | ICD-10-CM | POA: Diagnosis not present

## 2024-05-24 DIAGNOSIS — D0359 Melanoma in situ of other part of trunk: Secondary | ICD-10-CM | POA: Diagnosis not present

## 2024-05-24 MED ORDER — AMOXICILLIN-POT CLAVULANATE 875-125 MG PO TABS
1.0000 | ORAL_TABLET | Freq: Two times a day (BID) | ORAL | 0 refills | Status: AC
  Filled 2024-05-24 – 2024-05-25 (×2): qty 20, 10d supply, fill #0

## 2024-05-25 ENCOUNTER — Other Ambulatory Visit (HOSPITAL_COMMUNITY): Payer: Self-pay

## 2024-05-25 ENCOUNTER — Other Ambulatory Visit: Payer: Self-pay

## 2024-05-30 ENCOUNTER — Other Ambulatory Visit (HOSPITAL_COMMUNITY): Payer: Self-pay

## 2024-05-31 ENCOUNTER — Other Ambulatory Visit (HOSPITAL_COMMUNITY): Payer: Self-pay

## 2024-06-05 ENCOUNTER — Other Ambulatory Visit (HOSPITAL_COMMUNITY): Payer: Self-pay

## 2024-06-06 ENCOUNTER — Ambulatory Visit: Admitting: Podiatry

## 2024-06-06 ENCOUNTER — Encounter: Payer: Self-pay | Admitting: Podiatry

## 2024-06-06 DIAGNOSIS — L600 Ingrowing nail: Secondary | ICD-10-CM

## 2024-06-06 NOTE — Progress Notes (Signed)
 Patient presents follow-up nail surgery toe.  No complaints.  Physical exam:  Dermatologic: Nail surgery site for matrixectomy healing well with no signs of infection.  Diagnosis: 1.  Status post matrixectomy toe.  Healing well  Plan: - POV status post nail surgery , if patient has any problems she can call for appointment otherwise we can see her as needed  - Return as needed

## 2024-06-12 ENCOUNTER — Other Ambulatory Visit (HOSPITAL_COMMUNITY): Payer: Self-pay

## 2024-07-03 ENCOUNTER — Other Ambulatory Visit (HOSPITAL_COMMUNITY): Payer: Self-pay

## 2024-07-04 ENCOUNTER — Other Ambulatory Visit (HOSPITAL_COMMUNITY): Payer: Self-pay

## 2024-07-13 ENCOUNTER — Other Ambulatory Visit (HOSPITAL_BASED_OUTPATIENT_CLINIC_OR_DEPARTMENT_OTHER): Payer: Self-pay

## 2024-07-13 ENCOUNTER — Other Ambulatory Visit (HOSPITAL_COMMUNITY): Payer: Self-pay

## 2024-07-13 ENCOUNTER — Other Ambulatory Visit: Payer: Self-pay

## 2024-07-13 MED ORDER — TRAMADOL HCL 50 MG PO TABS
50.0000 mg | ORAL_TABLET | Freq: Two times a day (BID) | ORAL | 1 refills | Status: AC
  Filled 2024-07-13: qty 180, 90d supply, fill #0

## 2024-07-15 ENCOUNTER — Other Ambulatory Visit (HOSPITAL_COMMUNITY): Payer: Self-pay

## 2024-07-19 ENCOUNTER — Other Ambulatory Visit (HOSPITAL_COMMUNITY): Payer: Self-pay
